# Patient Record
Sex: Female | Born: 2005 | Race: White | Hispanic: No | Marital: Single | Smoking: Never smoker
Health system: Southern US, Community
[De-identification: ages and names within clinical notes are randomized; demographics above are authoritative.]

## PROBLEM LIST (undated history)

## (undated) DIAGNOSIS — M216X1 Other acquired deformities of right foot: Secondary | ICD-10-CM

## (undated) DIAGNOSIS — E669 Obesity, unspecified: Secondary | ICD-10-CM

## (undated) DIAGNOSIS — F840 Autistic disorder: Secondary | ICD-10-CM

## (undated) DIAGNOSIS — R159 Full incontinence of feces: Secondary | ICD-10-CM

## (undated) DIAGNOSIS — K5909 Other constipation: Secondary | ICD-10-CM

## (undated) DIAGNOSIS — R6250 Unspecified lack of expected normal physiological development in childhood: Secondary | ICD-10-CM

## (undated) DIAGNOSIS — F913 Oppositional defiant disorder: Secondary | ICD-10-CM

## (undated) DIAGNOSIS — F70 Mild intellectual disabilities: Secondary | ICD-10-CM

## (undated) DIAGNOSIS — K029 Dental caries, unspecified: Secondary | ICD-10-CM

## (undated) DIAGNOSIS — L209 Atopic dermatitis, unspecified: Secondary | ICD-10-CM

## (undated) DIAGNOSIS — Z22322 Carrier or suspected carrier of Methicillin resistant Staphylococcus aureus: Secondary | ICD-10-CM

## (undated) DIAGNOSIS — E559 Vitamin D deficiency, unspecified: Secondary | ICD-10-CM

## (undated) DIAGNOSIS — G40909 Epilepsy, unspecified, not intractable, without status epilepticus: Secondary | ICD-10-CM

## (undated) DIAGNOSIS — A4902 Methicillin resistant Staphylococcus aureus infection, unspecified site: Secondary | ICD-10-CM

## (undated) DIAGNOSIS — E039 Hypothyroidism, unspecified: Secondary | ICD-10-CM

## (undated) DIAGNOSIS — Q02 Microcephaly: Secondary | ICD-10-CM

## (undated) DIAGNOSIS — F988 Other specified behavioral and emotional disorders with onset usually occurring in childhood and adolescence: Secondary | ICD-10-CM

## (undated) DIAGNOSIS — F909 Attention-deficit hyperactivity disorder, unspecified type: Secondary | ICD-10-CM

## (undated) DIAGNOSIS — J45909 Unspecified asthma, uncomplicated: Secondary | ICD-10-CM

## (undated) HISTORY — PX: DENTAL SURGERY: SHX609

## (undated) HISTORY — DX: Autistic disorder: F84.0

## (undated) HISTORY — DX: Carrier or suspected carrier of methicillin resistant Staphylococcus aureus: Z22.322

## (undated) HISTORY — DX: Oppositional defiant disorder: F91.3

## (undated) HISTORY — DX: Attention-deficit hyperactivity disorder, unspecified type: F90.9

## (undated) HISTORY — DX: Methicillin resistant Staphylococcus aureus infection, unspecified site: A49.02

## (undated) HISTORY — DX: Other acquired deformities of right foot: M21.6X1

## (undated) HISTORY — DX: Atopic dermatitis, unspecified: L20.9

## (undated) HISTORY — DX: Full incontinence of feces: R15.9

## (undated) HISTORY — DX: Microcephaly: Q02

## (undated) HISTORY — DX: Epilepsy, unspecified, not intractable, without status epilepticus: G40.909

## (undated) HISTORY — PX: ADENOIDECTOMY: SHX5191

## (undated) HISTORY — DX: Dental caries, unspecified: K02.9

## (undated) HISTORY — DX: Mild intellectual disabilities: F70

## (undated) HISTORY — DX: Unspecified lack of expected normal physiological development in childhood: R62.50

## (undated) HISTORY — DX: Other constipation: K59.09

## (undated) HISTORY — PX: DENTAL REHABILITATION: SHX1449

## (undated) HISTORY — DX: Other specified behavioral and emotional disorders with onset usually occurring in childhood and adolescence: F98.8

## (undated) HISTORY — PX: TONSILLECTOMY: SHX5217

## (undated) HISTORY — DX: Vitamin D deficiency, unspecified: E55.9

---
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
## 2024-01-05 ENCOUNTER — Ambulatory Visit (INDEPENDENT_AMBULATORY_CARE_PROVIDER_SITE_OTHER): Payer: MEDICAID | Admitting: Family Medicine

## 2024-01-05 ENCOUNTER — Encounter: Payer: Self-pay | Admitting: Family Medicine

## 2024-01-05 DIAGNOSIS — R7301 Impaired fasting glucose: Secondary | ICD-10-CM | POA: Diagnosis not present

## 2024-01-05 LAB — BAYER DCA HB A1C WAIVED: HB A1C (BAYER DCA - WAIVED): 5.3 % (ref 4.8–5.6)

## 2024-01-05 MED ORDER — WEGOVY 1 MG/0.5ML ~~LOC~~ SOAJ: 1.0000 mg | SUBCUTANEOUS | 2 refills | Status: DC

## 2024-01-05 NOTE — Assessment & Plan Note (Signed)
 Down an additional 4 lbs for 19lbs (7.1%) total since starting wegovy. Will increase her to 1mg  wegovy in 3 weeks when she's done with her current supply and recheck in 3 months. Call with any concerns.

## 2024-01-05 NOTE — Progress Notes (Signed)
 BP 118/81 (BP Location: Left Arm, Patient Position: Sitting)   Pulse 87   Temp 98.4 F (36.9 C)   Ht 5\' 4"  (1.626 m)   Wt 248 lb (112.5 kg)   BMI 42.57 kg/m    Subjective:    Patient ID: Mia Miller, female    DOB: 12-19-2005, 18 y.o.   MRN: [id number]  HPI: Mia Miller is a 18 y.o. female  Chief Complaint  Patient presents with   Obesity   OBESITY- not throwing up, needing to take the zofran for about 3 days around the shot. Mom has not waited to see how she does- just gives her the meds Duration: chronic Previous attempts at weight loss: as able- developmentally delayed with polyphagia. Has calorie reduced diet at home  Complications of obesity: IFG Peak weight: 267 Weight loss goal: to be healthy Weight loss to date: 19 lbs Requesting obesity pharmacotherapy: yes Current weight loss supplements/medications: yes Previous weight loss supplements/meds: no  Impaired Fasting Glucose HbA1C:  Lab Results  Component Value Date   HGBA1C 5.5 10/09/2023   Duration of elevated blood sugar: chronic Polydipsia: no Polyuria: no Weight change: yes Visual disturbance: no Glucose Monitoring: no Diabetic Education: Not Completed Family history of diabetes: no   Relevant past medical, surgical, family and social history reviewed and updated as indicated. Interim medical history since our last visit reviewed. Allergies and medications reviewed and updated.  Review of Systems  Constitutional: Negative.   Respiratory: Negative.    Cardiovascular: Negative.   Gastrointestinal: Negative.   Musculoskeletal: Negative.   Psychiatric/Behavioral: Negative.      Per HPI unless specifically indicated above     Objective:    BP 118/81 (BP Location: Left Arm, Patient Position: Sitting)   Pulse 87   Temp 98.4 F (36.9 C)   Ht 5\' 4"  (1.626 m)   Wt 248 lb (112.5 kg)   BMI 42.57 kg/m   Wt Readings from Last 3 Encounters:  01/05/24 248 lb (112.5 kg) (>99%, Z= 2.45)*   11/20/23 253 lb 6.4 oz (114.9 kg) (>99%, Z= 2.49)*  10/09/23 267 lb 12.8 oz (121.5 kg) (>99%, Z= 2.57)*   * Growth percentiles are based on CDC (Girls, 2-20 Years) data.    Physical Exam Vitals and nursing note reviewed.  Constitutional:      General: She is not in acute distress.    Appearance: Normal appearance. She is obese. She is not ill-appearing, toxic-appearing or diaphoretic.  HENT:     Head: Normocephalic and atraumatic.     Right Ear: External ear normal.     Left Ear: External ear normal.     Nose: Nose normal.     Mouth/Throat:     Mouth: Mucous membranes are moist.     Pharynx: Oropharynx is clear.  Eyes:     General: No scleral icterus.       Right eye: No discharge.        Left eye: No discharge.     Extraocular Movements: Extraocular movements intact.     Conjunctiva/sclera: Conjunctivae normal.     Pupils: Pupils are equal, round, and reactive to light.  Cardiovascular:     Rate and Rhythm: Normal rate and regular rhythm.     Pulses: Normal pulses.     Heart sounds: Normal heart sounds. No murmur heard.    No friction rub. No gallop.  Pulmonary:     Effort: Pulmonary effort is normal. No respiratory distress.  Breath sounds: Normal breath sounds. No stridor. No wheezing, rhonchi or rales.  Chest:     Chest wall: No tenderness.  Musculoskeletal:        General: Normal range of motion.     Cervical back: Normal range of motion and neck supple.  Skin:    General: Skin is warm and dry.     Capillary Refill: Capillary refill takes less than 2 seconds.     Coloration: Skin is not jaundiced or pale.     Findings: No bruising, erythema, lesion or rash.  Neurological:     General: No focal deficit present.     Mental Status: She is alert and oriented to person, place, and time. Mental status is at baseline.  Psychiatric:        Mood and Affect: Mood normal.        Behavior: Behavior normal.        Thought Content: Thought content normal.         Judgment: Judgment normal.     Results for orders placed or performed in visit on 11/20/23  Lipase   Collection Time: 11/20/23 10:48 AM  Result Value Ref Range   Lipase 25 14 - 72 U/L  Amylase   Collection Time: 11/20/23 10:48 AM  Result Value Ref Range   Amylase 52 31 - 110 U/L  Comp Met (CMET)   Collection Time: 11/20/23 10:48 AM  Result Value Ref Range   Glucose 60 (L) 70 - 99 mg/dL   BUN 7 6 - 20 mg/dL   Creatinine, Ser 5.36 0.57 - 1.00 mg/dL   eGFR 644 >03 KV/QQV/9.56   BUN/Creatinine Ratio 10 9 - 23   Sodium 141 134 - 144 mmol/L   Potassium 4.5 3.5 - 5.2 mmol/L   Chloride 101 96 - 106 mmol/L   CO2 23 20 - 29 mmol/L   Calcium 10.3 (H) 8.7 - 10.2 mg/dL   Total Protein 7.6 6.0 - 8.5 g/dL   Albumin 4.4 4.0 - 5.0 g/dL   Globulin, Total 3.2 1.5 - 4.5 g/dL   Bilirubin Total <3.8 0.0 - 1.2 mg/dL   Alkaline Phosphatase 74 42 - 106 IU/L   AST 17 0 - 40 IU/L   ALT 13 0 - 32 IU/L      Assessment & Plan:   Problem List Items Addressed This Visit       Endocrine   IFG (impaired fasting glucose)   Rechecking A1c today. Await results.       Relevant Orders   Bayer DCA Hb A1c Waived     Other   Morbid obesity (HCC) - Primary   Down an additional 4 lbs for 19lbs (7.1%) total since starting wegovy. Will increase her to 1mg  wegovy in 3 weeks when she's done with her current supply and recheck in 3 months. Call with any concerns.        Relevant Medications   Semaglutide-Weight Management (WEGOVY) 1 MG/0.5ML SOAJ     Follow up plan: Return in about 3 months (around 04/05/2024).

## 2024-01-05 NOTE — Assessment & Plan Note (Signed)
Rechecking A1c today. Await results. 

## 2024-01-06 ENCOUNTER — Other Ambulatory Visit: Payer: Self-pay | Admitting: Family Medicine

## 2024-01-06 DIAGNOSIS — R1013 Epigastric pain: Secondary | ICD-10-CM

## 2024-01-08 NOTE — Telephone Encounter (Signed)
 Requested Prescriptions  Pending Prescriptions Disp Refills   omeprazole (PRILOSEC) 20 MG capsule [Pharmacy Med Name: OMEPRAZOLE 20MG  CAPSULES] 90 capsule 1    Sig: TAKE 1 CAPSULE(20 MG) BY MOUTH DAILY     Gastroenterology: Proton Pump Inhibitors Passed - 01/08/2024  8:51 AM      Passed - Valid encounter within last 12 months    Recent Outpatient Visits           3 days ago Morbid obesity [facility])   [facility] [place], Megan P, DO   1 month ago Morbid obesity [facility])   [facility] [name], DO       Future Appointments             In 1 month [name], MD [facility]

## 2024-01-21 ENCOUNTER — Other Ambulatory Visit: Payer: Self-pay | Admitting: Family Medicine

## 2024-01-22 MED ORDER — ALBUTEROL SULFATE (2.5 MG/3ML) 0.083% IN NEBU: 2.5000 mg | INHALATION_SOLUTION | Freq: Four times a day (QID) | RESPIRATORY_TRACT | 0 refills | Status: DC | PRN

## 2024-01-23 ENCOUNTER — Encounter: Payer: Self-pay | Admitting: Family Medicine

## 2024-02-21 ENCOUNTER — Other Ambulatory Visit: Payer: Self-pay | Admitting: Obstetrics

## 2024-02-23 ENCOUNTER — Encounter: Payer: Self-pay | Admitting: Family Medicine

## 2024-02-24 ENCOUNTER — Ambulatory Visit: Payer: MEDICAID | Admitting: Dermatology

## 2024-03-10 NOTE — Telephone Encounter (Unsigned)
 Copied from CRM [id number]. Topic: General - Other >> Mar 10, 2024 12:02 PM Tiffany S wrote: Reason for CRM: Patient is needing all medications to go to [facility]  [email or web address] [facility] [address] [place], [place] [address] [phone]

## 2024-03-15 ENCOUNTER — Ambulatory Visit: Payer: MEDICAID | Admitting: Dermatology

## 2024-03-15 ENCOUNTER — Encounter: Payer: Self-pay | Admitting: Dermatology

## 2024-03-15 DIAGNOSIS — Z7189 Other specified counseling: Secondary | ICD-10-CM

## 2024-03-15 DIAGNOSIS — Z79899 Other long term (current) drug therapy: Secondary | ICD-10-CM

## 2024-03-15 DIAGNOSIS — B360 Pityriasis versicolor: Secondary | ICD-10-CM

## 2024-03-15 DIAGNOSIS — L732 Hidradenitis suppurativa: Secondary | ICD-10-CM

## 2024-03-15 DIAGNOSIS — L858 Other specified epidermal thickening: Secondary | ICD-10-CM

## 2024-03-15 DIAGNOSIS — L83 Acanthosis nigricans: Secondary | ICD-10-CM | POA: Diagnosis not present

## 2024-03-15 DIAGNOSIS — Z792 Long term (current) use of antibiotics: Secondary | ICD-10-CM

## 2024-03-15 DIAGNOSIS — L219 Seborrheic dermatitis, unspecified: Secondary | ICD-10-CM

## 2024-03-15 MED ORDER — DOXYCYCLINE HYCLATE 50 MG PO CAPS: ORAL_CAPSULE | ORAL | 6 refills | Status: AC

## 2024-03-15 MED ORDER — KETOCONAZOLE 2 % EX SHAM: 1.0000 | MEDICATED_SHAMPOO | CUTANEOUS | 6 refills | Status: DC

## 2024-03-15 MED ORDER — KETOCONAZOLE 2 % EX CREA: TOPICAL_CREAM | CUTANEOUS | 6 refills | Status: DC

## 2024-03-15 MED ORDER — CLINDAMYCIN PHOSPHATE 1 % EX SOLN: CUTANEOUS | 9 refills | Status: AC

## 2024-03-15 MED ORDER — CLOBETASOL PROPIONATE 0.05 % EX FOAM: CUTANEOUS | 6 refills | Status: AC

## 2024-03-15 NOTE — Progress Notes (Signed)
 Follow-Up Visit   Subjective  Mia Miller is a 18 y.o. female who presents for the following: here today with mother for follow up on seborrheic dermatitis at scalp, mother reports patient is using ketoconazole  shampoo and clobetasol  foam on alternate days a week. Still has some flares. 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  History of hidradenitis suppurativa at b/l axilla and flanks - mother reports taking doxycycline  50 mg po qd with dinner and clindamycin  solution daily to areas. Still has some flares.  Hx of Keratosis Pilaris at arms mother would like to discuss alternative treatment patient has been breakout out at upper arms.   The patient has spots, moles and lesions to be evaluated, some may be new or changing and the patient may have concern these could be cancer.  The following portions of the chart were reviewed this encounter and updated as appropriate: medications, allergies, medical history  Review of Systems:  No other skin or systemic complaints except as noted in HPI or Assessment and Plan.  Objective  Well appearing patient in no apparent distress; mood and affect are within normal limits.   A focused examination was performed of the following areas: B/l arms, b/l legs, b/l axilla, scalp   Relevant exam findings are noted in the Assessment and Plan.   Assessment & Plan   SEBORRHEIC DERMATITIS scalp Exam: minimal flaking at scalp Chronic and persistent condition with duration or expected duration over one year. Condition is improving with treatment but not currently at goal. Seborrheic Dermatitis is a chronic persistent rash characterized by pinkness and scaling most commonly of the mid face but also can occur on the scalp (dandruff), ears; mid chest, mid back and groin.  It tends to be exacerbated by stress and cooler weather.  People who have neurologic disease may experience new onset or  exacerbation of existing seborrheic dermatitis.  The condition is not curable but treatable and can be controlled. Treatment Plan: Cont Ketoconazole  2% shampoo 3d/wk Monday, Wednesday, Friday, let sit 10 minutes and rinse out Continue Clobetasol  foam 3 nights/wk Tuesday, Thursday, Saturday, to aa scalp, avoid f/g/a   Topical steroids (such as triamcinolone , fluocinolone, fluocinonide, mometasone , clobetasol , halobetasol, betamethasone , hydrocortisone) can cause thinning and lightening of the skin if they are used for too long in the same area. Your physician has selected the right strength medicine for your problem and area affected on the body. Please use your medication only as directed by your physician to prevent side effects.     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 Currently well-controlled. Exam: legs clear today Tinea versicolor is a chronic recurrent skin rash causing discolored scaly spots most commonly seen on back, chest, and/or shoulders.  It is generally asymptomatic. The rash is due to overgrowth of a common type of yeast present on everyone's skin and it is not contagious.  It tends to flare more in the summer due to increased sweating on trunk.  After rash is treated, the scaliness will resolve, but the discoloration will take longer to return to normal pigmentation. The periodic use of an OTC medicated soap/shampoo with zinc or selenium sulfide can be helpful to prevent yeast overgrowth and recurrence. Treatment:   Cont Ketoconazole  2% cr every day prn   HIDRADENITIS SUPPURATIVA Bil axilla Exam: axillae clear today.  Chronic and persistent condition with  duration or expected duration over one year. Condition is improving with treatment but not currently at goal. Hidradenitis Suppurativa is a chronic; persistent; non-curable, but treatable condition  due to abnormal inflamed sweat glands in the body folds (axilla, inframammary, groin, medial thighs), causing recurrent painful draining cysts and scarring. It can be associated with severe scarring acne and cysts; also abscesses and scarring of scalp. The goal is control and prevention of flares, as it is not curable. Scars are permanent and can be thickened. Treatment may include daily use of topical medication and oral antibiotics.  Oral isotretinoin may also be helpful.  For some cases, Humira or Cosentyx (biologic injections) may be prescribed to decrease the inflammatory process and prevent flares.  When indicated, inflamed cysts may also be treated surgically.  Treatment Plan: Cont  Doxycycline  50mg  1 po qd with dinner (if doing well, can stop Doxycyline and re-start with flares, but if she does not do well off oral Doxycycline , may need to continue daily) Cont Clindamycin  sol qd to aa axilla, flanks   Recommend OTC Joesoef Anti Acne Sulfur Soap to use as a body wash  Doxycycline  should be taken with food to prevent nausea. Do not lay down for 30 minutes after taking. Be cautious with sun exposure and use good sun protection while on this medication. Pregnant women should not take this medication.   KERATOSIS PILARIS - Tiny follicular keratotic papules At b/l arms with  - Benign. Genetic in nature. No cure. - Observe. - If desired, patient can use an emollient (moisturizer) containing ammonium lactate (AmLactin), urea or salicylic acid once a day to smooth the area  Recommend starting moisturizer with exfoliant (Urea, Salicylic acid, or Lactic acid) one to two times daily to help smooth rough and bumpy skin.  OTC options include Cetaphil Rough and Bumpy lotion (Urea), Eucerin Roughness Relief lotion or spot treatment cream (Urea), CeraVe SA lotion/cream for Rough and Bumpy skin (Sal Acid), Gold Bond Rough and Bumpy cream (Sal Acid), and AmLactin 12% lotion/cream (Lactic Acid).  If applying in  morning, also apply sunscreen to sun-exposed areas, since these exfoliating moisturizers can increase sensitivity to sun.   Recommend OTC Joesoef Anti Acne Sulfur Soap to use as a body wash to affected areas daily.  (found in local drug store or online) (information included in handout)  SEBORRHEIC DERMATITIS   Related Medications mometasone  (ELOCON ) 0.1 % lotion APPLY TOPICALLY THREE TIMES A WEEK TO SCALP ON MONDAY, WEDNESDAY AND FRIDAYS clobetasol  (OLUX ) 0.05 % topical foam Apply topically 3 (three) times a week. Apply to scalp 3 times a week Tuesday, Thursday, Saturday at bedtime and leave on overnight, avoid face, groin, axilla ketoconazole  (NIZORAL ) 2 % shampoo Apply 1 Application topically 3 (three) times a week. Wash scalp 3 times weekly, Monday, Wednesday, Friday, let sit 10 minutes and rinse out HIDRADENITIS SUPPURATIVA   Related Medications clindamycin  (CLEOCIN  T) 1 % external solution Apply topically See admin instructions. APPLY TOPICALLY TO AXILLA ONCE DAILY doxycycline  (VIBRAMYCIN ) 50 MG capsule TAKE 1 CAPSULE BY MOUTH ONCE A DAY WITH DINNER( TAKE WITH FOOD AND DRINK) TINEA VERSICOLOR   Related Medications ketoconazole  (NIZORAL ) 2 % cream Apply topically See admin instructions. APPLY 1 APPLICATORFUL TOPICALLY TO NECK AND LEGS DAILY  Return for 8 to 9 month hs follow tv, and seb derm.  Laurelyn Ponder, CMA, am acting as scribe for Celine Collard, MD.   Documentation: I have reviewed the above documentation for accuracy and completeness, and I agree with the  above.  Celine Collard, MD

## 2024-03-15 NOTE — Patient Instructions (Addendum)
 Continue for seborrheic dermatitis at scalp   Cont Ketoconazole  2% shampoo 3d/wk Monday, Wednesday, Friday, let sit 10 minutes and rinse out Continue Clobetasol  foam 3 nights/wk Tuesday, Thursday, Saturday, to aa scalp, avoid f/g/a  For tinea versicolor at legs  Continue ketoconazole  cream as needed for flares  For Hidradenitis Suppurativa at b/l under arms  Continue   Doxycycline  50mg  1 by mouth daily  with dinner Cont Clindamycin  sol daily to aa axilla, flanks    For Keratosis pilaris at arms  Can use JOESOEF SKIN CARE Sulfur Soap for Acne Pharmaceutical Grade Dermatologists Approved for Acne Rosacea 100G Can by purchased online or found in local drug store      Due to recent changes in healthcare laws, you may see results of your pathology and/or laboratory studies on MyChart before the doctors have had a chance to review them. We understand that in some cases there may be results that are confusing or concerning to you. Please understand that not all results are received at the same time and often the doctors may need to interpret multiple results in order to provide you with the best plan of care or course of treatment. Therefore, we ask that you please give us  2 business days to thoroughly review all your results before contacting the office for clarification. Should we see a critical lab result, you will be contacted sooner.   If You Need Anything After Your Visit  If you have any questions or concerns for your doctor, please call our main line at [phone] and press option 4 to reach your doctor's medical assistant. If no one answers, please leave a voicemail as directed and we will return your call as soon as possible. Messages left after 4 pm will be answered the following business day.   You may also send us  a message via MyChart. We typically respond to MyChart messages within 1-2 business days.  For prescription refills, please ask your pharmacy to contact  our office. Our fax number is [phone].  If you have an urgent issue when the clinic is closed that cannot wait until the next business day, you can page your doctor at the number below.    Please note that while we do our best to be available for urgent issues outside of office hours, we are not available 24/7.   If you have an urgent issue and are unable to reach us , you may choose to seek medical care at your doctor's office, retail clinic, urgent care center, or emergency room.  If you have a medical emergency, please immediately call 911 or go to the emergency department.  Pager Numbers  - Dr. Bary Likes: [phone]  - Dr. Annette Barters: [phone]  - Dr. Felipe Horton: [phone]   In the event of inclement weather, please call our main line at [phone] for an update on the status of any delays or closures.  Dermatology Medication Tips: Please keep the boxes that topical medications come in in order to help keep track of the instructions about where and how to use these. Pharmacies typically print the medication instructions only on the boxes and not directly on the medication tubes.   If your medication is too expensive, please contact our office at [phone] option 4 or send us  a message through MyChart.   We are unable to tell what your co-pay for medications will be in advance as this is different depending on your insurance coverage. However, we may be able to find  a substitute medication at lower cost or fill out paperwork to get insurance to cover a needed medication.   If a prior authorization is required to get your medication covered by your insurance company, please allow us  1-2 business days to complete this process.  Drug prices often vary depending on where the prescription is filled and some pharmacies may offer cheaper prices.  The website www.goodrx.com contains coupons for medications through different pharmacies. The prices here do not account for what the  cost may be with help from insurance (it may be cheaper with your insurance), but the website can give you the price if you did not use any insurance.  - You can print the associated coupon and take it with your prescription to the pharmacy.  - You may also stop by our office during regular business hours and pick up a GoodRx coupon card.  - If you need your prescription sent electronically to a different pharmacy, notify our office through [facility] or by phone at [phone] option 4.     Si Usted Necesita Algo Despus de Su Visita  Tambin puede enviarnos un mensaje a travs de Clinical cytogeneticist. Por lo general respondemos a los mensajes de MyChart en el transcurso de 1 a 2 das hbiles.  Para renovar recetas, por favor pida a su farmacia que se ponga en contacto con nuestra oficina. Franz Jacks de fax es [place] [phone].  Si tiene un asunto urgente cuando la clnica est cerrada y que no puede esperar hasta el siguiente da hbil, puede llamar/localizar a su doctor(a) al nmero que aparece a continuacin.   Por favor, tenga en cuenta que aunque hacemos todo lo posible para estar disponibles para asuntos urgentes fuera del horario de [place], no estamos disponibles las 24 horas del da, los 7 [address]  [address] de la [place].   Si tiene un problema urgente y no puede comunicarse con nosotros, puede optar por buscar atencin mdica  en el consultorio de su doctor(a), en una clnica privada, en un centro de atencin urgente o en una sala de emergencias.  Si tiene Engineer, drilling, por favor llame inmediatamente al 911 o vaya a la sala de emergencias.  Nmeros de bper  - Dr. Bary Likes: [phone]  - Dra. Annette Barters: [phone]  - Dr. Felipe Horton: [phone]   En caso de inclemencias del tiempo, por favor llame a Lajuan Pila principal al [phone] para una actualizacin sobre el [place] de cualquier retraso o cierre.  Consejos para la medicacin en dermatologa: Por favor, guarde las cajas  en las que vienen los medicamentos de uso tpico para ayudarle a seguir las instrucciones sobre dnde y cmo usarlos. Las farmacias generalmente imprimen las instrucciones del medicamento slo en las cajas y no directamente en los tubos del [place].   Si su medicamento es muy caro, por favor, pngase en contacto con Bettyjane Brunet llamando al [phone] y presione la opcin 4 o envenos un mensaje a travs de Clinical cytogeneticist.   No podemos decirle cul ser su copago por los medicamentos por adelantado ya que esto es diferente dependiendo de la cobertura de su seguro. Sin embargo, es posible que podamos encontrar un medicamento sustituto a Audiological scientist un formulario para que el seguro cubra el medicamento que se considera necesario.   Si se requiere una autorizacin previa para que su compaa de seguros [place] su medicamento, por favor permtanos de 1 a 2 das hbiles para completar este proceso.  Los precios de los medicamentos varan con frecuencia dependiendo  del lugar de dnde se surte la receta y alguna farmacias pueden ofrecer precios ms baratos.  El sitio web www.goodrx.com tiene cupones para medicamentos de Health and safety inspector. Los precios aqu no tienen en cuenta lo que podra costar con la ayuda del seguro (puede ser ms barato con su seguro), pero el sitio web puede darle el precio si no utiliz Tourist information centre manager.  - Puede imprimir el cupn correspondiente y llevarlo con su receta a la farmacia.  - Tambin puede pasar por nuestra oficina durante el horario de atencin regular y Education officer, museum una tarjeta de cupones de GoodRx.  - Si necesita que su receta se enve electrnicamente a una farmacia diferente, informe a nuestra oficina a travs de MyChart de [place] o por telfono llamando al [phone] y presione la opcin 4.

## 2024-03-16 ENCOUNTER — Other Ambulatory Visit: Payer: Self-pay

## 2024-03-16 DIAGNOSIS — R1013 Epigastric pain: Secondary | ICD-10-CM

## 2024-03-16 MED ORDER — ALBUTEROL SULFATE HFA 108 (90 BASE) MCG/ACT IN AERS: 2.0000 | INHALATION_SPRAY | RESPIRATORY_TRACT | 0 refills | Status: DC | PRN

## 2024-03-16 MED ORDER — NYSTATIN 100000 UNIT/GM EX CREA: TOPICAL_CREAM | Freq: Two times a day (BID) | CUTANEOUS | 0 refills | Status: DC

## 2024-03-16 MED ORDER — OMEPRAZOLE 20 MG PO CPDR: DELAYED_RELEASE_CAPSULE | ORAL | 0 refills | Status: DC

## 2024-03-16 MED ORDER — ROPINIROLE HCL 0.5 MG PO TABS: ORAL_TABLET | ORAL | 0 refills | Status: DC

## 2024-03-16 MED ORDER — METFORMIN HCL 500 MG PO TABS: 500.0000 mg | ORAL_TABLET | Freq: Two times a day (BID) | ORAL | 0 refills | Status: DC

## 2024-03-16 MED ORDER — BUDESONIDE-FORMOTEROL FUMARATE 80-4.5 MCG/ACT IN AERO: INHALATION_SPRAY | RESPIRATORY_TRACT | 0 refills | Status: DC

## 2024-03-16 MED ORDER — ONDANSETRON 4 MG PO TBDP: 4.0000 mg | ORAL_TABLET | Freq: Three times a day (TID) | ORAL | 0 refills | Status: DC | PRN

## 2024-03-16 MED ORDER — ALBUTEROL SULFATE (2.5 MG/3ML) 0.083% IN NEBU: 2.5000 mg | INHALATION_SOLUTION | Freq: Four times a day (QID) | RESPIRATORY_TRACT | 0 refills | Status: DC | PRN

## 2024-03-16 NOTE — Telephone Encounter (Signed)
 Copied from CRM [id number]. Topic: General - Other >> Mar 10, 2024 12:02 PM Tiffany S wrote: Reason for CRM: Patient is needing all medications to go to [facility]  [email or web address] [address] [facility] [address] [place], [place] [address] [phone] >> Mar 16, 2024 11:24 AM Crispin Dolphin wrote: [facility] Called. States pt changing to their pharmacy. They need med list and new Rx for any maintenance drugs. Fax: [phone]. Thank You

## 2024-03-17 ENCOUNTER — Other Ambulatory Visit: Payer: Self-pay | Admitting: Family Medicine

## 2024-03-17 ENCOUNTER — Ambulatory Visit: Payer: Self-pay

## 2024-03-17 MED ORDER — WEGOVY 1 MG/0.5ML ~~LOC~~ SOAJ: 1.0000 mg | SUBCUTANEOUS | 0 refills | Status: DC

## 2024-03-17 NOTE — Telephone Encounter (Signed)
Has been faxed as requested.  ?

## 2024-03-17 NOTE — Telephone Encounter (Signed)
 Copied from CRM [id number]. Topic: Clinical - Medication Refill >> Mar 17, 2024  3:44 PM Adaline Holly wrote: Medication: NYSTATIN  powder  Has the patient contacted their pharmacy? No   This is the patient's preferred pharmacy:  [facility] [place] [place] - [address] [place] [address] Phone: [phone] Fax: [phone]  Is this the correct pharmacy for this prescription? Yes    Has the prescription been filled recently? Yes  Is the patient out of the medication? Yes  Has the patient been seen for an appointment in the last year OR does the patient have an upcoming appointment? Yes  Can we respond through MyChart? Yes

## 2024-03-17 NOTE — Telephone Encounter (Signed)
 Copied from CRM [id number]. Topic: Clinical - Medication Question >> Mar 17, 2024  4:10 PM Adaline Holly wrote: Ethyl Hering with [facility] called to get a drug list faxed over for patient.

## 2024-03-19 NOTE — Telephone Encounter (Signed)
 Requested medication (s) are due for refill today: yes  Requested medication (s) are on the active medication list: yes  Last refill:  10/10/23 60 grams 4 RF  Future visit scheduled: yes  Notes to clinic:  med not assigned to protocol   Requested Prescriptions  Pending Prescriptions Disp Refills   nystatin  powder 60 g 4     Off-Protocol Failed - 03/19/2024 11:04 AM      Failed - Medication not assigned to a protocol, review manually.      Passed - Valid encounter within last 12 months    Recent Outpatient Visits           2 months ago Morbid obesity [facility])   [place] [facility] [place], Megan P, DO   4 months ago Morbid obesity [facility])    [facility] Solomon Dupre, DO       Future Appointments             In 8 months Elta Halter, MD [facility] [place] [facility]

## 2024-03-20 ENCOUNTER — Encounter: Payer: Self-pay | Admitting: Family Medicine

## 2024-03-20 ENCOUNTER — Encounter: Payer: Self-pay | Admitting: Dermatology

## 2024-03-21 MED ORDER — NYSTATIN 100000 UNIT/GM EX POWD: Freq: Three times a day (TID) | CUTANEOUS | 4 refills | Status: AC

## 2024-03-23 ENCOUNTER — Encounter: Payer: Self-pay | Admitting: Family Medicine

## 2024-03-26 ENCOUNTER — Other Ambulatory Visit: Payer: Self-pay | Admitting: Family Medicine

## 2024-03-30 ENCOUNTER — Other Ambulatory Visit: Payer: Self-pay | Admitting: Family Medicine

## 2024-03-30 NOTE — Telephone Encounter (Signed)
 Requested medication (s) are due for refill today: yes  Requested medication (s) are on the active medication list: yes  Last refill:  Vitamin D : 10/17/23 12 caps 1 RF              Nystatin : 03/21/24 60 grams 4 RF  Future visit scheduled: yes  Notes to clinic:  Vitamin D  not delegated to NT to RF and Nystatin  not assigned to a protocol   Requested Prescriptions  Pending Prescriptions Disp Refills   Vitamin D , Ergocalciferol , (DRISDOL ) 1.25 MG (50000 UNIT) CAPS capsule [Pharmacy Med Name: VITAMIN D2 50,000IU (ERGO) CAP RX] 12 capsule 1    Sig: TAKE 1 CAPSULE BY MOUTH EVERY 7 DAYS     Endocrinology:  Vitamins - Vitamin D  Supplementation 2 Failed - 03/30/2024 11:00 AM      Failed - Manual Review: Route requests for 50,000 IU strength to the provider      Failed - Ca in normal range and within 360 days    Calcium  Date Value Ref Range Status  11/20/2023 10.3 (H) 8.7 - 10.2 mg/dL Final         Failed - Vitamin D  in normal range and within 360 days    Vit D, 25-Hydroxy  Date Value Ref Range Status  10/09/2023 23.2 (L) 30.0 - 100.0 ng/mL Final    Comment:    Vitamin D  deficiency has been defined by the Institute of Medicine and an Endocrine Society practice guideline as a level of serum 25-OH vitamin D  less than 20 ng/mL (1,2). The Endocrine Society went on to further define vitamin D  insufficiency as a level between 21 and 29 ng/mL (2). 1. IOM (Institute of Medicine). 2010. Dietary reference    intakes for calcium and D. [place]: The    [employer]. 2. Holick MF, Binkley [place], Bischoff-Ferrari HA, et al.    Evaluation, treatment, and prevention of vitamin D     deficiency: an Endocrine Society clinical practice    guideline. JCEM. 2011 Jul; 96(7):1911-30.          Passed - Valid encounter within last 12 months    Recent Outpatient Visits           2 months ago Morbid obesity [facility])   [place] [facility] [place], Megan P, DO   4 months ago  [facility]bid obesity [facility])   [place] [facility] Vicci Duwaine SQUIBB, DO       Future Appointments             In 7 months Hester Alm BROCKS, MD [place] [facility]             NYSTATIN  powder [Pharmacy Med Name: NYSTOP  TOP PWDR 100,000 15GM] 15 g     Sig: APPLY TOPICALLY THREE TIMES DAILY     Off-Protocol Failed - 03/30/2024 11:00 AM      Failed - Medication not assigned to a protocol, review manually.      Passed - Valid encounter within last 12 months    Recent Outpatient Visits           2 months ago [facility]bid [facility])   [place] [facility] [place], Megan P, DO   4 months ago Morbid obesity [facility])   [place] [facility] Vicci Duwaine SQUIBB, DO       Future Appointments             In 7 months Hester Alm BROCKS, MD  [facility] [place] [facility]

## 2024-03-30 NOTE — Telephone Encounter (Signed)
 Copied from CRM [id number]. Topic: Clinical - Prescription Issue >> Mar 30, 2024  9:28 AM Willma R wrote: Reason for CRM: Pel from [facility] pharmacy calling regards to patients refill for nystatin  powder and Vitamin D , Ergocalciferol , (DRISDOL ) 1.25 MG (50000 UNIT) CAPS capsule. States they did not get any refill information and it asking if it can be sent again.  Pel from [facility] pharmacy can be reached at [phone]

## 2024-04-05 ENCOUNTER — Ambulatory Visit: Payer: MEDICAID | Admitting: Family Medicine

## 2024-04-08 ENCOUNTER — Encounter: Payer: Self-pay | Admitting: Family Medicine

## 2024-04-14 ENCOUNTER — Other Ambulatory Visit: Payer: Self-pay | Admitting: Family Medicine

## 2024-04-16 ENCOUNTER — Other Ambulatory Visit: Payer: Self-pay | Admitting: Family Medicine

## 2024-04-16 NOTE — Telephone Encounter (Signed)
 Mom calling back and requesting refill on nyastatin powder and vitamin D . Mom states she forgot to include these in earlier refill request.

## 2024-04-16 NOTE — Telephone Encounter (Unsigned)
 Copied from CRM [id number]. Topic: Clinical - Medication Refill >> Apr 16, 2024 11:06 AM Jalayah J wrote: Medication: Semaglutide -Weight Management (WEGOVY ) 1 MG/0.5ML SOAJ   Has the patient contacted their pharmacy? Yes (Agent: If no, request that the patient contact the pharmacy for the refill. If patient does not wish to contact the pharmacy document the reason why and proceed with request.) (Agent: If yes, when and what did the pharmacy advise?)  This is the patient's preferred pharmacy:  [facility] - [place], [place] - [address] [place] [address] Phone: [phone] Fax: [phone]    Is this the correct pharmacy for this prescription? Yes If no, delete pharmacy and type the correct one.   Has the prescription been filled recently? Yes  Is the patient out of the medication? Yes  Has the patient been seen for an appointment in the last year OR does the patient have an upcoming appointment? Yes  Can we respond through MyChart? Yes  Agent: Please be advised that Rx refills may take up to 3 business days. We ask that you follow-up with your pharmacy.

## 2024-04-16 NOTE — Telephone Encounter (Signed)
 Requested Prescriptions  Pending Prescriptions Disp Refills   Semaglutide -Weight Management (WEGOVY ) 1 MG/0.5ML SOAJ [Pharmacy Med Name: WEGOVY  1 MG/0.5ML SUBQ SOLN ML] 2 mL 1    Sig: INJECT 1MG  SUBCUTANEOUSLY ONCE A WEEK     Endocrinology:  Diabetes - GLP-1 Receptor Agonists - semaglutide  Passed - 04/16/2024 11:53 AM      Passed - HBA1C in normal range and within 180 days    HB A1C (BAYER DCA - WAIVED)  Date Value Ref Range Status  01/05/2024 5.3 4.8 - 5.6 % Final    Comment:             Prediabetes: 5.7 - 6.4          Diabetes: >6.4          Glycemic control for adults with diabetes: <7.0          Passed - Cr in normal range and within 360 days    Creat  Date Value Ref Range Status  12/01/2019 0.59 0.40 - 1.00 mg/dL Final   Creatinine, Ser  Date Value Ref Range Status  11/20/2023 0.72 0.57 - 1.00 mg/dL Final         Passed - Valid encounter within last 6 months    Recent Outpatient Visits           3 months ago Morbid obesity [facility])   [place] [facility] [place], Megan P, DO   4 months ago Morbid [facility])   [place] [facility] Vicci Duwaine SQUIBB, DO       Future Appointments             In 7 months Hester Alm BROCKS, MD [facility] [place] [facility]

## 2024-04-19 MED ORDER — WEGOVY 0.5 MG/0.5ML ~~LOC~~ SOAJ: 0.5000 mg | SUBCUTANEOUS | 1 refills | Status: DC

## 2024-04-19 NOTE — Telephone Encounter (Signed)
 Requested medication (s) are due for refill today: yes  Requested medication (s) are on the active medication list: yes  Last refill:  03/16/24 #90  Future visit scheduled: yes  Notes to clinic:  med not delegated to NT to RF   Requested Prescriptions  Pending Prescriptions Disp Refills   ondansetron  (ZOFRAN -ODT) 4 MG disintegrating tablet [Pharmacy Med Name: ONDANSETRON  4 MG ODT] 90 tablet     Sig: DISSOLVE 1 TABLET ON THE TONGUE EVERY 8 HOURS AS NEEDED FOR NAUSEA AND VOMITING.TAKE EVERY 8 HOURS ON DAY OF WEGOVY  AND 2ND DAY AFTER. OTHERWISE AS NEEDED     Not Delegated - Gastroenterology: Antiemetics - ondansetron  Failed - 04/19/2024  2:05 PM      Failed - This refill cannot be delegated      Passed - AST in normal range and within 360 days    AST  Date Value Ref Range Status  11/20/2023 17 0 - 40 IU/L Final         Passed - ALT in normal range and within 360 days    ALT  Date Value Ref Range Status  11/20/2023 13 0 - 32 IU/L Final         Passed - Valid encounter within last 6 months    Recent Outpatient Visits           3 months ago Morbid obesity (HCC)   [place] [facility] [place], Megan P, DO   5 months ago Morbid [facility])   [place] [facility] Vicci Duwaine SQUIBB, DO       Future Appointments             In 7 months Hester Alm BROCKS, MD [place] [facility]            Signed Prescriptions Disp Refills   budesonide -formoterol  (SYMBICORT ) 80-4.5 MCG/ACT inhaler 10.2 g 0    Sig: INHALE 2 PUFFS TWICE A DAY RINSE MOUTH WITH WATER AFTER EACH USE     Pulmonology:  Combination Products Passed - 04/19/2024  2:05 PM      Passed - Valid encounter within last 12 months    Recent Outpatient Visits           3 months ago Morbid obesity (HCC)   [place] [facility] [place], Megan P, DO   5 months ago Morbid obesity [facility])   [place] [facility] Vicci Duwaine SQUIBB, DO       Future  Appointments             In 7 months Hester Alm BROCKS, MD [facility]             VENTOLIN  HFA 108 (90 Base) MCG/ACT inhaler 18 g 0    Sig: INHALE 2 PUFFS INTO THE LUNGS EVERY 4 HOURS AS NEEDED FOR WHEEZING OR SHORTNESS OF BREATH     Pulmonology:  Beta Agonists 2 Passed - 04/19/2024  2:05 PM      Passed - Last BP in normal range    BP Readings from Last 1 Encounters:  01/05/24 118/81         Passed - Last Heart Rate in normal range    Pulse Readings from Last 1 Encounters:  01/05/24 87         Passed - Valid encounter within last 12 months    Recent Outpatient Visits           3 months ago  [facility]bid [facility])   [place] [facility] [place], Megan P, DO   5 months ago Morbid obesity [facility])   [place] [facility] [place], [place], DO       Future Appointments             In 7 months Hester Alm BROCKS, MD [place] [facility]             albuterol  (PROVENTIL ) (2.5 MG/3ML) 0.083% nebulizer solution 150 mL 0    Sig: USE 1 VIAL VIA NEBULIZER EVERY 6 HOURS AS NEEDED FOR WHEEZING OR SHORTNESS OF BREATH     Pulmonology:  Beta Agonists 2 Passed - 04/19/2024  2:05 PM      Passed - Last BP in normal range    BP Readings from Last 1 Encounters:  01/05/24 118/81         Passed - Last Heart Rate in normal range    Pulse Readings from Last 1 Encounters:  01/05/24 87         Passed - Valid encounter within last 12 months    Recent Outpatient Visits           3 months ago Morbid obesity [facility])   [place] [facility] [place], Megan P, DO   5 months ago Morbid obesity [facility])    [facility] Vicci Duwaine SQUIBB, DO       Future Appointments             In 7 months Hester Alm BROCKS, MD [facility]

## 2024-04-19 NOTE — Telephone Encounter (Signed)
 Requested Prescriptions  Pending Prescriptions Disp Refills   Semaglutide -Weight Management (WEGOVY ) 0.5 MG/0.5ML SOAJ 2 mL 1    Sig: Inject 0.5 mg into the skin once a week.     Endocrinology:  Diabetes - GLP-1 Receptor Agonists - semaglutide  Passed - 04/19/2024 12:40 PM      Passed - HBA1C in normal range and within 180 days    HB A1C (BAYER DCA - WAIVED)  Date Value Ref Range Status  01/05/2024 5.3 4.8 - 5.6 % Final    Comment:             Prediabetes: 5.7 - 6.4          Diabetes: >6.4          Glycemic control for adults with diabetes: <7.0          Passed - Cr in normal range and within 360 days    Creat  Date Value Ref Range Status  12/01/2019 0.59 0.40 - 1.00 mg/dL Final   Creatinine, Ser  Date Value Ref Range Status  11/20/2023 0.72 0.57 - 1.00 mg/dL Final         Passed - Valid encounter within last 6 months    Recent Outpatient Visits           3 months ago [facility]bid obesity [facility])   [place] [facility] [place], Megan P, DO   5 months ago Morbid obesity [facility])   [place] Vicci Duwaine SQUIBB, DO       Future Appointments             In 7 months Hester Alm BROCKS, MD [facility] [place]

## 2024-04-19 NOTE — Telephone Encounter (Signed)
 Copied from CRM [id number]. Topic: Clinical - Medication Question >> Apr 19, 2024 12:49 PM Carlyon D wrote: Reason for CRM: Mandy from the pharmacy is calling in regards to pt Wegovy . She wants to verify that the dosge is suppose to be 0.5 instead of 1 mg as the pt was previously on 1 mg and is now going to  a lower dosage. Mandy at the pharmacy would like a Call back the number to reach her is... [phone]

## 2024-04-19 NOTE — Telephone Encounter (Signed)
 Requested Prescriptions  Pending Prescriptions Disp Refills   budesonide -formoterol  (SYMBICORT ) 80-4.5 MCG/ACT inhaler [Pharmacy Med Name: SYMBICORT  80-4.5 MCG/ACT INH AERO G] 10.2 g 0    Sig: INHALE 2 PUFFS TWICE A DAY RINSE MOUTH WITH WATER AFTER EACH USE     Pulmonology:  Combination Products Passed - 04/19/2024  2:04 PM      Passed - Valid encounter within last 12 months    Recent Outpatient Visits           3 months ago [facility]bid [facility])   [place] [facility] [place], Megan P, DO   5 months ago Morbid obesity [facility])   [place] [facility] Vicci Duwaine SQUIBB, DO       Future Appointments             In 7 months Hester Alm BROCKS, MD [facility]             VENTOLIN  HFA 108 [phone] Base) MCG/ACT inhaler [Pharmacy Med Name: VENTOLIN  HFA 108 (90 BASE) MCG/ACT] 18 g 0    Sig: INHALE 2 PUFFS INTO THE LUNGS EVERY 4 HOURS AS NEEDED FOR WHEEZING OR SHORTNESS OF BREATH     Pulmonology:  Beta Agonists 2 Passed - 04/19/2024  2:04 PM      Passed - Last BP in normal range    BP Readings from Last 1 Encounters:  01/05/24 118/81         Passed - Last Heart Rate in normal range    Pulse Readings from Last 1 Encounters:  01/05/24 87         Passed - Valid encounter within last 12 months    Recent Outpatient Visits           3 months ago Morbid obesity (HCC)   [place] [facility] [place], Megan P, DO   5 months ago Morbid obesity [facility])   [place] [facility] Vicci Duwaine SQUIBB, DO       Future Appointments             In 7 months Hester Alm BROCKS, MD [place] [facility]             ondansetron  (ZOFRAN -ODT) 4 MG disintegrating tablet [Pharmacy Med Name: ONDANSETRON  4 MG ODT] 90 tablet     Sig: DISSOLVE 1 TABLET ON THE TONGUE EVERY 8 HOURS AS NEEDED FOR NAUSEA AND VOMITING.TAKE EVERY 8 HOURS ON DAY OF WEGOVY  AND 2ND DAY AFTER. OTHERWISE AS NEEDED     Not Delegated -  Gastroenterology: Antiemetics - ondansetron  Failed - 04/19/2024  2:04 PM      Failed - This refill cannot be delegated      Passed - AST in normal range and within 360 days    AST  Date Value Ref Range Status  11/20/2023 17 0 - 40 IU/L Final         Passed - ALT in normal range and within 360 days    ALT  Date Value Ref Range Status  11/20/2023 13 0 - 32 IU/L Final         Passed - Valid encounter within last 6 months    Recent Outpatient Visits           3 months ago Morbid [facility])   [place] [facility] [place], Megan P, DO   5 months ago Morbid obesity [facility])   [place] [facility] [place], [place],  DO       Future Appointments             In 7 months Hester Alm BROCKS, MD [place] [facility]             albuterol  (PROVENTIL ) (2.5 MG/3ML) 0.083% nebulizer solution [Pharmacy Med Name: ALBUTEROL  SULFATE (2.5 MG/3ML) 0.08] 150 mL 0    Sig: USE 1 VIAL VIA NEBULIZER EVERY 6 HOURS AS NEEDED FOR WHEEZING OR SHORTNESS OF BREATH     Pulmonology:  Beta Agonists 2 Passed - 04/19/2024  2:04 PM      Passed - Last BP in normal range    BP Readings from Last 1 Encounters:  01/05/24 118/81         Passed - Last Heart Rate in normal range    Pulse Readings from Last 1 Encounters:  01/05/24 87         Passed - Valid encounter within last 12 months    Recent Outpatient Visits           3 months ago [facility]bid [facility])   [place] [facility] [place], Megan P, DO   5 months ago [facility]bid [facility])   [place] [facility] Vicci Duwaine SQUIBB, DO       Future Appointments             In 7 months Hester Alm BROCKS, MD [facility] [place] [facility]

## 2024-04-20 ENCOUNTER — Other Ambulatory Visit: Payer: Self-pay | Admitting: Family Medicine

## 2024-04-20 NOTE — Telephone Encounter (Signed)
 Copied from CRM [id number]. Topic: Clinical - Medication Refill >> Apr 20, 2024 11:50 AM Wess RAMAN wrote: Medication: BLISOVI FE 1/20 1-20 MG-MCG tablet   Has the patient contacted their pharmacy? Yes (Agent: If no, request that the patient contact the pharmacy for the refill. If patient does not wish to contact the pharmacy document the reason why and proceed with request.) (Agent: If yes, when and what did the pharmacy advise?)  This is the patient's preferred pharmacy:  [facility] - [place], [place] - [address] [place] [address] Phone: [phone] Fax: [phone]  Is this the correct pharmacy for this prescription? Yes If no, delete pharmacy and type the correct one.   Has the prescription been filled recently? Yes  Is the patient out of the medication? Yes  Has the patient been seen for an appointment in the last year OR does the patient have an upcoming appointment? Yes  Can we respond through MyChart? Yes  Agent: Please be advised that Rx refills may take up to 3 business days. We ask that you follow-up with your pharmacy.

## 2024-04-20 NOTE — Telephone Encounter (Signed)
 Copied from CRM [id number]. Topic: Clinical - Prescription Issue >> Apr 20, 2024 11:51 AM Wess RAMAN wrote: Reason for CRM: Lorn from [facility] stated the pharmacy needs clarification on which wegovy  strength to provide and a new prescription for Semaglutide -Weight Management (WEGOVY ) 0.5 MG/0.5ML SOAJ  Pharmacy: [facility] - [place] - [address] [place] [address] Phone: [phone] Fax: [phone] Hours: Not open 24 hours

## 2024-04-22 NOTE — Telephone Encounter (Signed)
 Copied from CRM [id number]. Topic: Clinical - Medication Question >> Apr 22, 2024 10:34 AM Daved SQUIBB wrote: Reason for CRM: Lorn from [facility] called for an update on med request [phone] is her callback

## 2024-04-22 NOTE — Telephone Encounter (Signed)
 Spoke with [employer]. Corrected prescriber on patient birth control and who it should be sent to instead. No needs at this time.

## 2024-04-22 NOTE — Telephone Encounter (Signed)
 Requested medication (s) are due for refill today: yes  Requested medication (s) are on the active medication list: yes  Last refill:  02/23/24  Future visit scheduled: no  Notes to clinic:  Unable to refill per protocol, last refill by another provider. Routing to PCP for review.     Requested Prescriptions  Pending Prescriptions Disp Refills   norethindrone-ethinyl estradiol -FE (BLISOVI FE 1/20) 1-20 MG-MCG tablet 84 tablet 3    Sig: Take 1 tablet by mouth daily.     OB/GYN:  Contraceptives Passed - 04/22/2024  9:15 AM      Passed - Last BP in normal range    BP Readings from Last 1 Encounters:  01/05/24 118/81         Passed - Valid encounter within last 12 months    Recent Outpatient Visits           3 months ago Morbid obesity [facility])   [place] [facility] [place], Megan P, DO   5 months ago Morbid [facility])   [place] [facility] Vicci Duwaine SQUIBB, DO       Future Appointments             In 6 months Hester Alm BROCKS, MD [facility] [place] [facility]            Passed - Patient is not a smoker

## 2024-05-12 ENCOUNTER — Other Ambulatory Visit: Payer: Self-pay | Admitting: Family Medicine

## 2024-05-14 NOTE — Telephone Encounter (Signed)
 Too soon for refill.  Requested Prescriptions  Pending Prescriptions Disp Refills   albuterol  (PROVENTIL ) (2.5 MG/3ML) 0.083% nebulizer solution [Pharmacy Med Name: ALBUTEROL  SULFATE (2.5 MG/3ML) 0.08] 150 mL 0    Sig: USE 1 VIAL VIA NEBULIZER EVERY 6 HOURS AS NEEDED FOR WHEEZING OR SHORTNESS OF BREATH     Pulmonology:  Beta Agonists 2 Passed - 05/14/2024  1:12 PM      Passed - Last BP in normal range    BP Readings from Last 1 Encounters:  01/05/24 118/81         Passed - Last Heart Rate in normal range    Pulse Readings from Last 1 Encounters:  01/05/24 87         Passed - Valid encounter within last 12 months    Recent Outpatient Visits           4 months ago Morbid obesity (HCC)   [place] [facility] [place], Megan P, DO   5 months ago Morbid obesity [facility])   [place] [facility] [place], [place], DO       Future Appointments             In 6 months Hester Alm BROCKS, MD [place] [facility]             VENTOLIN  HFA 108 [phone] Base) MCG/ACT inhaler [Pharmacy Med Name: VENTOLIN  HFA 108 (90 BASE) MCG/ACT] 18 g 0    Sig: INHALE 2 PUFFS INTO THE LUNGS EVERY 4 HOURS AS NEEDED FOR WHEEZING OR SHORTNESS OF BREATH     Pulmonology:  Beta Agonists 2 Passed - 05/14/2024  1:12 PM      Passed - Last BP in normal range    BP Readings from Last 1 Encounters:  01/05/24 118/81         Passed - Last Heart Rate in normal range    Pulse Readings from Last 1 Encounters:  01/05/24 87         Passed - Valid encounter within last 12 months    Recent Outpatient Visits           4 months ago Morbid obesity (HCC)   [place] [facility] [place], Megan P, DO   5 months ago Morbid obesity [facility])   [place] [facility] Vicci Duwaine SQUIBB, DO       Future Appointments             In 6 months Hester Alm BROCKS, MD [place] [facility]             budesonide -formoterol  (SYMBICORT ) 80-4.5  MCG/ACT inhaler [Pharmacy Med Name: SYMBICORT  80-4.5 MCG/ACT INH AERO G] 10.2 g 0    Sig: INHALE 2 PUFFS TWICE A DAY RINSE MOUTH WITH WATER AFTER EACH USE     Pulmonology:  Combination Products Passed - 05/14/2024  1:12 PM      Passed - Valid encounter within last 12 months    Recent Outpatient Visits           4 months ago [facility]bid [facility])   [place] [facility] [place], Megan P, DO   5 months ago Morbid obesity [facility])    [facility] Vicci Duwaine SQUIBB, DO       Future Appointments             In 6 months Hester Alm BROCKS, MD [facility] [place] [facility]

## 2024-05-17 ENCOUNTER — Other Ambulatory Visit: Payer: Self-pay | Admitting: Family Medicine

## 2024-05-19 ENCOUNTER — Other Ambulatory Visit: Payer: Self-pay | Admitting: Family Medicine

## 2024-05-19 NOTE — Telephone Encounter (Signed)
 Copied from CRM [id number]. Topic: Clinical - Medication Refill >> May 19, 2024 11:13 AM Sophia H wrote: Medication: albuterol  (PROVENTIL ) (2.5 MG/3ML) 0.083% nebulizer solution budesonide -formoterol  (SYMBICORT ) 80-4.5 MCG/ACT inhaler  VENTOLIN  HFA 108 (90 Base) MCG/ACT inhaler ondansetron  (ZOFRAN -ODT) 4 MG disintegrating tablet   Has the patient contacted their pharmacy? Yes, pharmacy calling on behalf of the patient to check in on fills. Faxed over requests with no response.   This is the patient's preferred pharmacy:  [facility] - [place], [place] - [address] [place] [address] Phone: [phone] Fax: [phone]  Is this the correct pharmacy for this prescription? Yes If no, delete pharmacy and type the correct one.   Has the prescription been filled recently? Yes  Is the patient out of the medication? Yes  Has the patient been seen for an appointment in the last year OR does the patient have an upcoming appointment? Yes, has an appt on 08/18.   Can we respond through MyChart? Yes  Agent: Please be advised that Rx refills may take up to 3 business days. We ask that you follow-up with your pharmacy.

## 2024-05-20 NOTE — Telephone Encounter (Signed)
 Requested Prescriptions  Pending Prescriptions Disp Refills   ondansetron  (ZOFRAN -ODT) 4 MG disintegrating tablet [Pharmacy Med Name: ONDANSETRON  4 MG ODT] 90 tablet 0    Sig: DISSOLVE 1 TABLET ON THE TONGUE EVERY 8 HOURS AS NEEDED FOR NAUSEA AND VOMITING.TAKE EVERY 8 HOURS ON DAY OF WEGOVY  AND 2ND DAY AFTER. OTHERWISE AS NEEDED     Not Delegated - Gastroenterology: Antiemetics - ondansetron  Failed - 05/20/2024  1:21 PM      Failed - This refill cannot be delegated      Passed - AST in normal range and within 360 days    AST  Date Value Ref Range Status  11/20/2023 17 0 - 40 IU/L Final         Passed - ALT in normal range and within 360 days    ALT  Date Value Ref Range Status  11/20/2023 13 0 - 32 IU/L Final         Passed - Valid encounter within last 6 months    Recent Outpatient Visits           4 months ago Morbid obesity (HCC)   [place] [facility] [place], Megan P, DO   6 months ago Morbid obesity [facility])   [place] [facility] [place], Duwaine SQUIBB, DO       Future Appointments             In 6 months Hester Alm BROCKS, MD [place] [facility]             albuterol  (PROVENTIL ) (2.5 MG/3ML) 0.083% nebulizer solution [Pharmacy Med Name: ALBUTEROL  SULFATE (2.5 MG/3ML) 0.08] 150 mL 0    Sig: USE 1 VIAL VIA NEBULIZER EVERY 6 HOURS AS NEEDED FOR WHEEZING OR SHORTNESS OF BREATH     Pulmonology:  Beta Agonists 2 Passed - 05/20/2024  1:21 PM      Passed - Last BP in normal range    BP Readings from Last 1 Encounters:  01/05/24 118/81         Passed - Last Heart Rate in normal range    Pulse Readings from Last 1 Encounters:  01/05/24 87         Passed - Valid encounter within last 12 months    Recent Outpatient Visits           4 months ago Morbid obesity (HCC)   [place] [facility] [place], Megan P, DO   6 months ago Morbid obesity [facility])   [place] [facility] [place] Duwaine SQUIBB, DO        Future Appointments             In 6 months Hester Alm BROCKS, MD [place] [facility]             VENTOLIN  HFA 108 [phone] Base) MCG/ACT inhaler [Pharmacy Med Name: VENTOLIN  HFA 108 (90 BASE) MCG/ACT] 18 g 0    Sig: INHALE 2 PUFFS INTO THE LUNGS EVERY 4 HOURS AS NEEDED FOR WHEEZING OR SHORTNESS OF BREATH     Pulmonology:  Beta Agonists 2 Passed - 05/20/2024  1:21 PM      Passed - Last BP in normal range    BP Readings from Last 1 Encounters:  01/05/24 118/81         Passed - Last Heart Rate in normal range    Pulse Readings from Last 1 Encounters:  01/05/24 87  Passed - Valid encounter within last 12 months    Recent Outpatient Visits           4 months ago Morbid obesity [facility])   [place] [facility] [place], Megan P, DO   6 months ago Morbid obesity [facility])   [place] [facility] [place] Duwaine SQUIBB, DO       Future Appointments             In 6 months Hester Alm BROCKS, MD [place] [facility]             budesonide -formoterol  (SYMBICORT ) 80-4.5 MCG/ACT inhaler [Pharmacy Med Name: SYMBICORT  80-4.5 MCG/ACT INH AERO G] 10.2 g 0    Sig: INHALE 2 PUFFS TWICE A DAY RINSE MOUTH WITH WATER AFTER EACH USE     Pulmonology:  Combination Products Passed - 05/20/2024  1:21 PM      Passed - Valid encounter within last 12 months    Recent Outpatient Visits           4 months ago Morbid obesity [facility])   [place] [facility] [place], Megan P, DO   6 months ago Morbid obesity [facility])   [place] [facility] [place] Duwaine SQUIBB, DO       Future Appointments             In 6 months Hester Alm BROCKS, MD [facility]

## 2024-05-20 NOTE — Telephone Encounter (Signed)
 Requested medication (s) are due for refill today: yes  Requested medication (s) are on the active medication list: {Yes  Last refill:    Future visit scheduled: {Yes  Notes to clinic:  Unable to refill per protocol, cannot delegate.      Requested Prescriptions  Pending Prescriptions Disp Refills   ondansetron  (ZOFRAN -ODT) 4 MG disintegrating tablet [Pharmacy Med Name: ONDANSETRON  4 MG ODT] 90 tablet 0    Sig: DISSOLVE 1 TABLET ON THE TONGUE EVERY 8 HOURS AS NEEDED FOR NAUSEA AND VOMITING.TAKE EVERY 8 HOURS ON DAY OF WEGOVY  AND 2ND DAY AFTER. OTHERWISE AS NEEDED     Not Delegated - Gastroenterology: Antiemetics - ondansetron  Failed - 05/20/2024  1:22 PM      Failed - This refill cannot be delegated      Passed - AST in normal range and within 360 days    AST  Date Value Ref Range Status  11/20/2023 17 0 - 40 IU/L Final         Passed - ALT in normal range and within 360 days    ALT  Date Value Ref Range Status  11/20/2023 13 0 - 32 IU/L Final         Passed - Valid encounter within last 6 months    Recent Outpatient Visits           4 months ago Morbid obesity (HCC)   [place] [facility] [place], Megan P, DO   6 months ago Morbid obesity [facility])   [place] [facility] [place], [place], DO       Future Appointments             In 6 months Hester Alm BROCKS, MD [facility] [place] [facility]            Signed Prescriptions Disp Refills   albuterol  (PROVENTIL ) (2.5 MG/3ML) 0.083% nebulizer solution 150 mL 0    Sig: USE 1 VIAL VIA NEBULIZER EVERY 6 HOURS AS NEEDED FOR WHEEZING OR SHORTNESS OF BREATH     Pulmonology:  Beta Agonists 2 Passed - 05/20/2024  1:22 PM      Passed - Last BP in normal range    BP Readings from Last 1 Encounters:  01/05/24 118/81         Passed - Last Heart Rate in normal range    Pulse Readings from Last 1 Encounters:  01/05/24 87         Passed - Valid encounter within last 12 months     Recent Outpatient Visits           4 months ago Morbid obesity (HCC)   [place] [facility] [place], Megan P, DO   6 months ago Morbid obesity [facility])   [place] [facility] Vic[facility] Duwaine SQUIBB, DO       Future Appointments             In 6 months Hester Alm BROCKS, MD [place]  [facility]             VENTOLIN  HFA 108 [phone] Base) MCG/ACT inhaler 18 g 0    Sig: INHALE 2 PUFFS INTO THE LUNGS EVERY 4 HOURS AS NEEDED FOR WHEEZING OR SHORTNESS OF BREATH     Pulmonology:  Beta Agonists 2 Passed - 05/20/2024  1:22 PM      Passed - Last BP in normal range    BP Readings from Last 1 Encounters:  01/05/24 118/81  Passed - Last Heart Rate in normal range    Pulse Readings from Last 1 Encounters:  01/05/24 87         Passed - Valid encounter within last 12 months    Recent Outpatient Visits           4 months ago Morbid obesity (HCC)   [place] [facility] [place], Megan P, DO   6 months ago Morbid obesity [facility])   [place] [facility] Vic[place] Duwaine SQUIBB, DO       Future Appointments             In 6 months Hester Alm BROCKS, MD [place] [facility]             budesonide -formoterol  (SYMBICORT ) 80-4.5 MCG/ACT inhaler 10.2 g 0    Sig: INHALE 2 PUFFS TWICE A DAY RINSE MOUTH WITH WATER AFTER EACH USE     Pulmonology:  Combination Products Passed - 05/20/2024  1:22 PM      Passed - Valid encounter within last 12 months    Recent Outpatient Visits           4 months ago Morbid obesity [facility])   [place] [facility] [place], Megan P, DO   6 months ago Morbid obesity [facility])   [facility] Duwaine SQUIBB, DO       Future Appointments             In 6 months Hester Alm BROCKS, MD [facility]

## 2024-05-21 NOTE — Telephone Encounter (Signed)
 Requested Prescriptions  Refused Prescriptions Disp Refills   albuterol  (PROVENTIL ) (2.5 MG/3ML) 0.083% nebulizer solution 150 mL 0     Pulmonology:  Beta Agonists 2 Passed - 05/21/2024  2:38 PM      Passed - Last BP in normal range    BP Readings from Last 1 Encounters:  01/05/24 118/81         Passed - Last Heart Rate in normal range    Pulse Readings from Last 1 Encounters:  01/05/24 87         Passed - Valid encounter within last 12 months    Recent Outpatient Visits           4 months ago Morbid obesity (HCC)   [place] [facility] [place], Megan P, DO   6 months ago Morbid obesity [facility])   [place] [facility] Vic[facility] Duwaine SQUIBB, DO       Future Appointments             In 5 months Hester Alm BROCKS, MD [place] [facility]             budesonide -formoterol  (SYMBICORT ) 80-4.5 MCG/ACT inhaler 10.2 g 0    Sig: INHALE 2 PUFFS TWICE A DAY RINSE MOUTH WITH WATER AFTER EACH USE     Pulmonology:  Combination Products Passed - 05/21/2024  2:38 PM      Passed - Valid encounter within last 12 months    Recent Outpatient Visits           4 months ago [facility]bid [facility])   [place] [facility] [place], Megan P, DO   6 months ago Morbid obesity [facility])   [place] [facility], [place], DO       Future Appointments             In 5 months Hester Alm BROCKS, MD [place] [facility]             albuterol  (VENTOLIN  HFA) 108 [phone] Base) MCG/ACT inhaler 18 g 0    Sig: Inhale 2 puffs into the lungs every 4 (four) hours as needed for wheezing or shortness of breath.     Pulmonology:  Beta Agonists 2 Passed - 05/21/2024  2:38 PM      Passed - Last BP in normal range    BP Readings from Last 1 Encounters:  01/05/24 118/81         Passed - Last Heart Rate in normal range    Pulse Readings from Last 1 Encounters:  01/05/24 87         Passed - Valid encounter within last 12  months    Recent Outpatient Visits           4 months ago Morbid obesity [facility])   [place] [facility] [place], Megan P, DO   6 months ago Morbid obesity [facility])   [place] [facility] Vicci Duwaine SQUIBB, DO       Future Appointments             In 5 months Hester Alm BROCKS, MD [facility] [place] [facility]

## 2024-05-21 NOTE — Telephone Encounter (Signed)
 Requested Prescriptions  Refused Prescriptions Disp Refills   WEGOVY  1 MG/0.5ML SOAJ SQ injection [Pharmacy Med Name: WEGOVY  1 MG/0.5ML SUBQ SOLN ML] 2 mL 1    Sig: INJECT 1MG  SUBCUTANEOUSLY ONCE A WEEK     Endocrinology:  Diabetes - GLP-1 Receptor Agonists - semaglutide  Passed - 05/21/2024  2:03 PM      Passed - HBA1C in normal range and within 180 days    HB A1C (BAYER DCA - WAIVED)  Date Value Ref Range Status  01/05/2024 5.3 4.8 - 5.6 % Final    Comment:             Prediabetes: 5.7 - 6.4          Diabetes: >6.4          Glycemic control for adults with diabetes: <7.0          Passed - Cr in normal range and within 360 days    Creat  Date Value Ref Range Status  12/01/2019 0.59 0.40 - 1.00 mg/dL Final   Creatinine, Ser  Date Value Ref Range Status  11/20/2023 0.72 0.57 - 1.00 mg/dL Final         Passed - Valid encounter within last 6 months    Recent Outpatient Visits           4 months ago Morbid obesity [facility])   [place] [facility] [place], Megan P, DO   6 months ago Morbid obesity [facility])   [place] [facility] Vicci Duwaine SQUIBB, DO       Future Appointments             In 5 months Hester Alm BROCKS, MD [facility] [place] [facility]

## 2024-05-24 ENCOUNTER — Encounter: Payer: Self-pay | Admitting: Family Medicine

## 2024-05-24 ENCOUNTER — Ambulatory Visit (INDEPENDENT_AMBULATORY_CARE_PROVIDER_SITE_OTHER): Payer: MEDICAID | Admitting: Family Medicine

## 2024-05-24 VITALS — BP 118/90 | HR 111 | Temp 98.3°F | Ht 64.0 in | Wt 245.2 lb

## 2024-05-24 DIAGNOSIS — J4520 Mild intermittent asthma, uncomplicated: Secondary | ICD-10-CM

## 2024-05-24 DIAGNOSIS — R7301 Impaired fasting glucose: Secondary | ICD-10-CM | POA: Diagnosis not present

## 2024-05-24 DIAGNOSIS — Z0000 Encounter for general adult medical examination without abnormal findings: Secondary | ICD-10-CM | POA: Diagnosis not present

## 2024-05-24 DIAGNOSIS — E559 Vitamin D deficiency, unspecified: Secondary | ICD-10-CM | POA: Diagnosis not present

## 2024-05-24 DIAGNOSIS — R1013 Epigastric pain: Secondary | ICD-10-CM

## 2024-05-24 DIAGNOSIS — Z23 Encounter for immunization: Secondary | ICD-10-CM | POA: Diagnosis not present

## 2024-05-24 DIAGNOSIS — E538 Deficiency of other specified B group vitamins: Secondary | ICD-10-CM

## 2024-05-24 MED ORDER — OMEPRAZOLE 20 MG PO CPDR: DELAYED_RELEASE_CAPSULE | ORAL | 1 refills | Status: AC

## 2024-05-24 MED ORDER — ROPINIROLE HCL 0.5 MG PO TABS: ORAL_TABLET | ORAL | 1 refills | Status: AC

## 2024-05-24 MED ORDER — METFORMIN HCL 500 MG PO TABS: 500.0000 mg | ORAL_TABLET | Freq: Two times a day (BID) | ORAL | 1 refills | Status: DC

## 2024-05-24 MED ORDER — ONDANSETRON 4 MG PO TBDP: 4.0000 mg | ORAL_TABLET | Freq: Three times a day (TID) | ORAL | 0 refills | Status: AC | PRN

## 2024-05-24 MED ORDER — BUDESONIDE-FORMOTEROL FUMARATE 80-4.5 MCG/ACT IN AERO: INHALATION_SPRAY | RESPIRATORY_TRACT | 6 refills | Status: AC

## 2024-05-24 MED ORDER — SEMAGLUTIDE-WEIGHT MANAGEMENT 1.7 MG/0.75ML ~~LOC~~ SOAJ: 1.7000 mg | SUBCUTANEOUS | 0 refills | Status: DC

## 2024-05-24 MED ORDER — ALBUTEROL SULFATE HFA 108 (90 BASE) MCG/ACT IN AERS: 2.0000 | INHALATION_SPRAY | RESPIRATORY_TRACT | 6 refills | Status: AC | PRN

## 2024-05-24 NOTE — Assessment & Plan Note (Signed)
 Rechecking labs today. Await results. Treat as needed.

## 2024-05-24 NOTE — Progress Notes (Signed)
 BP (!) 118/90   Pulse (!) 111   Temp 98.3 F (36.8 C) (Oral)   Ht 5' 4 (1.626 m)   Wt 245 lb 3.2 oz (111.2 kg)   SpO2 98%   BMI 42.09 kg/m    Subjective:    Patient ID: Mia Miller, female    DOB: [birth year], 18 y.o.   MRN: [id number]  HPI: Mia Miller is a 18 y.o. female presenting on 05/24/2024 for comprehensive medical examination. Current medical complaints include:  OBESITY- not throwing up, she takes her shots on Friday- last week she threw up (only time in the past 5 months) Duration: chronic Previous attempts at weight loss: as able- developmentally delayed with polyphagia. Has calorie reduced diet at home  Complications of obesity: IFG Peak weight: 267 Weight loss goal: to be healthy Weight loss to date: 22 lbs Requesting obesity pharmacotherapy: yes Current weight loss supplements/medications: yes Previous weight loss supplements/meds: no  ASTHMA Asthma status: stable Satisfied with current treatment?: yes Albuterol /rescue inhaler frequency: several times a week Dyspnea frequency: occasionally Wheezing frequency: occasionally Cough frequency: occasionally Nocturnal symptom frequency: never  Limitation of activity: no Current upper respiratory symptoms: no Aerochamber/spacer use: yes Visits to ER or Urgent Care in past year: no Pneumovax: Not up to Date Influenza: Not up to Date  She currently lives with: mother Menopausal Symptoms: no  Depression Screen done today and results listed below:     11/20/2023   10:06 AM 10/09/2023   10:05 AM 02/21/2023   11:15 AM 10/12/2021    1:38 PM 07/01/2018    3:35 PM  Depression screen PHQ 2/9  Decreased Interest 0 0 0 0 1  Down, Depressed, Hopeless 0 0 0 0 2  PHQ - 2 Score 0 0 0 0 3  Altered sleeping 2 1 2 2 1   Tired, decreased energy 1 1 1  0   Change in appetite 2 2 1 3 1   Feeling bad or failure about yourself  0 0 0 0   Trouble concentrating 2 3 2 3    Moving slowly or fidgety/restless 0 0 0 3    Suicidal thoughts 0 0 0 0   PHQ-9 Score 7 7 6 11 5   Difficult doing work/chores  Very difficult Somewhat difficult      Past Medical History:  Past Medical History:  Diagnosis Date   Acquired adduction deformity of foot, right    ADD (attention deficit disorder)    ADHD    Asthma    Atopic dermatitis    Autism    Chronic constipation    Dental caries    Development delay    Encopresis with constipation and overflow incontinence    Hypothyroid    Mental retardation, mild (I.Q. 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   BLISOVI FE 1/20 1-20 MG-MCG tablet TAKE 1 TABLET BY MOUTH DAILY   clindamycin  (CLEOCIN  T) 1 % external solution Apply topically See admin instructions. APPLY TOPICALLY TO AXILLA ONCE DAILY   clobetasol  (OLUX ) 0.05 % topical foam Apply topically 3 (three) times a week. Apply to scalp 3 times a week Tuesday, Thursday, Saturday at bedtime and leave on overnight, avoid face, groin, axilla   FOCALIN XR 25 MG CP24 Take 1 capsule by mouth daily.   guanFACINE  (INTUNIV ) 4 MG TB24 ER tablet Take 4 mg by mouth daily.   ketoconazole  (NIZORAL ) 2 % cream Apply topically See admin instructions. APPLY 1  APPLICATORFUL TOPICALLY TO NECK AND LEGS DAILY   ketoconazole  (NIZORAL ) 2 % shampoo Apply 1 Application topically 3 (three) times a week. Wash scalp 3 times weekly, Monday, Wednesday, Friday, let sit 10 minutes and rinse out   mometasone  (ELOCON ) 0.1 % lotion APPLY TOPICALLY THREE TIMES A WEEK TO SCALP ON MONDAY, WEDNESDAY AND FRIDAYS   nystatin  cream (MYCOSTATIN ) Apply topically 2 (two) times daily.   nystatin  powder Apply topically 3 (three) times daily.   SODIUM FLUORIDE 5000 PPM 1.1 % PSTE Brush to teeth once or twice daily. Do not rinse after use. Do not eat or drink for 30 minutes after use.   Spacer/Aero-Holding Chambers DEVI 1 each by Does not apply route as needed.   traZODone (DESYREL) 50 MG tablet Take 50-100 mg by mouth at bedtime.   cholecalciferol (VITAMIN D3) 25 MCG (1000 UNIT) tablet Take 1,000 Units by mouth daily.   doxycycline  (VIBRAMYCIN ) 50 MG capsule TAKE 1 CAPSULE BY MOUTH ONCE A DAY WITH DINNER( TAKE WITH FOOD AND DRINK)   Vitamin D , Ergocalciferol , (DRISDOL ) 1.25 MG (50000 UNIT) CAPS capsule Take 1 capsule (50,000 Units total) by mouth every 7 (seven) days.   Current Facility-Administered Medications on File Prior to Visit  Medication   cyanocobalamin  (VITAMIN B12) injection 1,000 mcg    Allergies:  Allergies  Allergen Reactions   Amoxicillin Diarrhea   Lansoprazole Diarrhea   Loratadine Other (See Comments)    Dried out very bad    Social History:  Social History   Socioeconomic History   Marital status: Single    Spouse name: Not on file   Number of children: Not on file   Years of education: Not on file   Highest education level: 9th grade  Occupational History   Not on file  Tobacco Use   Smoking status: Never    Passive exposure: Yes   Smokeless tobacco: Never   Tobacco comments:    inside smoking  Vaping Use   Vaping status: Never Used  Substance and Sexual Activity   Alcohol use: No   Drug use: No   Sexual activity: Never  Other Topics  Concern   Not on file  Social History Narrative   Lives with her mother, maternal grandmother, cousins, aunt, half brother.    She enjoys walking and going to the park.    Attends [employer] as a rising 9th grader   Social Drivers of Health   Financial Resource Strain: Medium Risk (01/02/2024)   Overall Financial Resource Strain (CARDIA)    Difficulty of Paying Living Expenses: Somewhat hard  Food Insecurity: Food Insecurity Present (01/02/2024)   Hunger  Vital Sign    Worried About Programme researcher, broadcasting/film/video in the Last Year: Sometimes true    Ran Out of Food in the Last Year: Sometimes true  Transportation Needs: Unmet Transportation Needs (01/02/2024)   PRAPARE - Transportation    Lack of Transportation (Medical): Yes    Lack of Transportation (Non-Medical): Yes  Physical Activity: Sufficiently Active (01/02/2024)   Exercise Vital Sign    Days of Exercise per Week: 5 days    Minutes of Exercise per Session: 100 min  Stress: No Stress Concern Present (01/02/2024)   [employer] of Occupational Health - Occupational Stress Questionnaire    Feeling of Stress : Only a little  Social Connections: Socially Isolated (01/02/2024)   Social Connection and Isolation Panel    Frequency of Communication with Friends and Family: Never    Frequency of Social Gatherings with Friends and Family: More than three times a week    Attends Religious Services: Never    Database administrator or Organizations: No    Attends Engineer, structural: Not on file    Marital Status: Never married  Intimate Partner Violence: Unknown (01/07/2022)   Received from [facility]   HITS    Physically Hurt: Not on file    Insult or Talk Down To: Not on file    Threaten Physical Harm: Not on file    Scream or Curse: Not on file   Social History   Tobacco Use  Smoking Status Never   Passive exposure: Yes  Smokeless Tobacco Never  Tobacco Comments   inside smoking   Social History   Substance and  Sexual Activity  Alcohol Use No    Family History:  Family History  Problem Relation Age of Onset   Anxiety disorder Mother    Depression Mother    Migraines Mother    Bipolar disorder Mother    Seizures Maternal Aunt    Migraines Maternal Aunt    ADD / ADHD Maternal Aunt    ADD / ADHD Maternal Aunt    Mental illness Maternal Grandmother    Stroke Maternal Grandmother    Thyroid  disease Maternal Grandmother    Heart disease Maternal Grandmother    Migraines Maternal Grandmother    Hypertension Maternal Grandfather    COPD Maternal Grandfather    Heart disease Maternal Grandfather    Seizures Maternal Grandfather    ADD / ADHD Cousin        Strong Mfhx of ADHD   Apraxia Cousin        Maternal 1 st cousin   Autism Cousin        Maternal 1 st cousin   Cancer Neg Hx    Diabetes Neg Hx     Past medical history, surgical history, medications, allergies, family history and social history reviewed with patient today and changes made to appropriate areas of the chart.   Review of Systems  Constitutional: Negative.   HENT: Negative.    Eyes: Negative.   Respiratory: Negative.    Cardiovascular: Negative.   Gastrointestinal:  Positive for diarrhea and vomiting. Negative for abdominal pain, blood in stool, constipation, heartburn, melena and nausea.       Smacking her stomach  Genitourinary: Negative.   Musculoskeletal:  Positive for back pain. Negative for falls, joint pain, myalgias and neck pain.  Skin:  Negative for itching and rash.       +acne  Neurological: Negative.   Endo/Heme/Allergies:  Positive for environmental allergies. Negative  for polydipsia. Does not bruise/bleed easily.  Psychiatric/Behavioral: Negative.     All other ROS negative except what is listed above and in the HPI.      Objective:    BP (!) 118/90   Pulse (!) 111   Temp 98.3 F (36.8 C) (Oral)   Ht 5' 4 (1.626 m)   Wt 245 lb 3.2 oz (111.2 kg)   SpO2 98%   BMI 42.09 kg/m   Wt Readings  from Last 3 Encounters:  05/24/24 245 lb 3.2 oz (111.2 kg) (>99%, Z= 2.44)*  01/05/24 248 lb (112.5 kg) (>99%, Z= 2.45)*  11/20/23 253 lb 6.4 oz (114.9 kg) (>99%, Z= 2.49)*   * Growth percentiles are based on CDC (Girls, 2-20 Years) data.    Physical Exam Vitals and nursing note reviewed.  Constitutional:      General: She is not in acute distress.    Appearance: Normal appearance. She is obese. She is not ill-appearing, toxic-appearing or diaphoretic.  HENT:     Head: Normocephalic and atraumatic.     Right Ear: Tympanic membrane, ear canal and external ear normal. There is no impacted cerumen.     Left Ear: Tympanic membrane, ear canal and external ear normal. There is no impacted cerumen.     Nose: Nose normal. No congestion or rhinorrhea.     Mouth/Throat:     Mouth: Mucous membranes are moist.     Pharynx: Oropharynx is clear. No oropharyngeal exudate or posterior oropharyngeal erythema.  Eyes:     General: No scleral icterus.       Right eye: No discharge.        Left eye: No discharge.     Extraocular Movements: Extraocular movements intact.     Conjunctiva/sclera: Conjunctivae normal.     Pupils: Pupils are equal, round, and reactive to light.  Neck:     Vascular: No carotid bruit.  Cardiovascular:     Rate and Rhythm: Normal rate and regular rhythm.     Pulses: Normal pulses.     Heart sounds: No murmur heard.    No friction rub. No gallop.  Pulmonary:     Effort: Pulmonary effort is normal. No respiratory distress.     Breath sounds: Normal breath sounds. No stridor. No wheezing, rhonchi or rales.  Chest:     Chest wall: No tenderness.  Abdominal:     General: Abdomen is flat. Bowel sounds are normal. There is no distension.     Palpations: Abdomen is soft. There is no mass.     Tenderness: There is no abdominal tenderness. There is no right CVA tenderness, left CVA tenderness, guarding or rebound.     Hernia: No hernia is present.  Genitourinary:    Comments:  Breast and pelvic exams deferred with shared decision making Musculoskeletal:        General: No swelling, tenderness, deformity or signs of injury.     Cervical back: Normal range of motion and neck supple. No rigidity. No muscular tenderness.     Right lower leg: No edema.     Left lower leg: No edema.  Lymphadenopathy:     Cervical: No cervical adenopathy.  Skin:    General: Skin is warm and dry.     Capillary Refill: Capillary refill takes less than 2 seconds.     Coloration: Skin is not jaundiced or pale.     Findings: No bruising, erythema, lesion or rash.  Neurological:     General: No focal  deficit present.     Mental Status: She is alert and oriented to person, place, and time. Mental status is at baseline.     Cranial Nerves: No cranial nerve deficit.     Sensory: No sensory deficit.     Motor: No weakness.     Coordination: Coordination normal.     Gait: Gait normal.     Deep Tendon Reflexes: Reflexes normal.  Psychiatric:        Mood and Affect: Mood normal.        Behavior: Behavior normal.        Thought Content: Thought content normal.        Judgment: Judgment normal.     Results for orders placed or performed in visit on 01/05/24  Bayer DCA Hb A1c Waived   Collection Time: 01/05/24 10:10 AM  Result Value Ref Range   HB A1C (BAYER DCA - WAIVED) 5.3 4.8 - 5.6 %      Assessment & Plan:   Problem List Items Addressed This Visit       Respiratory   Mild intermittent asthma without complication   Under good control on current regimen. Continue current regimen. Continue to monitor. Call with any concerns. Refills given. Tolerating her nebulizer well. Needs refill- will get them for her.        Relevant Medications   budesonide -formoterol  (SYMBICORT ) 80-4.5 MCG/ACT inhaler   albuterol  (VENTOLIN  HFA) 108 (90 Base) MCG/ACT inhaler     Endocrine   IFG (impaired fasting glucose)   Rechecking labs today. Await results. Treat as needed.        Other    Vitamin D  deficiency   Rechecking labs today. Await results. Treat as needed.      Morbid obesity (HCC)   Tolerating medicine well. Down 22lbs, 8.2% of her weight. Will increase her wegovy  to 1.7mg  and recheck in 3 months. Call with any concerns.       Relevant Medications   metFORMIN  (GLUCOPHAGE ) 500 MG tablet   semaglutide -weight management (WEGOVY ) 1.7 MG/0.75ML SOAJ SQ injection   B12 deficiency   Rechecking labs today. Await results. Treat as needed.      Other Visit Diagnoses       Routine general medical examination at a health care facility    -  Primary   Vaccines up to date. Screening labs checked today. Continue diet and exercise. Call with any concerns.   Relevant Orders   Bayer DCA Hb A1c Waived   CBC with Differential/Platelet   Comprehensive metabolic panel with GFR   Lipid Panel w/o Chol/HDL Ratio   TSH   VITAMIN D  25 Hydroxy (Vit-D Deficiency, Fractures)   B12     Dyspepsia       Relevant Medications   omeprazole  (PRILOSEC) 20 MG capsule        Follow up plan: Return in about 3 months (around 08/24/2024).   LABORATORY TESTING:  - Pap smear: not applicable  IMMUNIZATIONS:   - Tdap: Tetanus vaccination status reviewed: last tetanus booster within 10 years. - Influenza: Postponed to flu season - Prevnar: will get next visit - COVID: Refused - HPV: Administered today - Meningitis: Administered today  PATIENT COUNSELING:   Advised to take 1 mg of folate supplement per day if capable of pregnancy.   Sexuality: Discussed sexually transmitted diseases, partner selection, use of condoms, avoidance of unintended pregnancy  and contraceptive alternatives.   Advised to avoid cigarette smoking.  I discussed with the patient that most people  either abstain from alcohol or drink within safe limits (<=14/week and <=4 drinks/occasion for males, <=7/weeks and <= 3 drinks/occasion for females) and that the risk for alcohol disorders and other health effects  rises proportionally with the number of drinks per week and how often a drinker exceeds daily limits.  Discussed cessation/primary prevention of drug use and availability of treatment for abuse.   Diet: Encouraged to adjust caloric intake to maintain  or achieve ideal body weight, to reduce intake of dietary saturated fat and total fat, to limit sodium intake by avoiding high sodium foods and not adding table salt, and to maintain adequate dietary potassium and calcium preferably from fresh fruits, vegetables, and low-fat dairy products.    stressed the importance of regular exercise  Injury prevention: Discussed safety belts, safety helmets, smoke detector, smoking near bedding or upholstery.   Dental health: Discussed importance of regular tooth brushing, flossing, and dental visits.    NEXT PREVENTATIVE PHYSICAL DUE IN 1 YEAR. Return in about 3 months (around 08/24/2024).

## 2024-05-24 NOTE — Assessment & Plan Note (Signed)
 Under good control on current regimen. Continue current regimen. Continue to monitor. Call with any concerns. Refills given. Tolerating her nebulizer well. Needs refill- will get them for her.

## 2024-05-24 NOTE — Assessment & Plan Note (Signed)
 Tolerating medicine well. Down 22lbs, 8.2% of her weight. Will increase her wegovy  to 1.7mg  and recheck in 3 months. Call with any concerns.

## 2024-05-25 ENCOUNTER — Telehealth: Payer: Self-pay | Admitting: Family Medicine

## 2024-05-25 LAB — CBC WITH DIFFERENTIAL/PLATELET
Basophils Absolute: 0.1 x10E3/uL (ref 0.0–0.2)
Basos: 1 %
EOS (ABSOLUTE): 0.1 x10E3/uL (ref 0.0–0.4)
Eos: 1 %
Hematocrit: 35.9 % (ref 34.0–46.6)
Hemoglobin: 11.5 g/dL (ref 11.1–15.9)
Immature Grans (Abs): 0 x10E3/uL (ref 0.0–0.1)
Immature Granulocytes: 0 %
Lymphocytes Absolute: 3.7 x10E3/uL — ABNORMAL HIGH (ref 0.7–3.1)
Lymphs: 27 %
MCH: 27.1 pg (ref 26.6–33.0)
MCHC: 32 g/dL (ref 31.5–35.7)
MCV: 85 fL (ref 79–97)
Monocytes Absolute: 0.6 x10E3/uL (ref 0.1–0.9)
Monocytes: 5 %
Neutrophils Absolute: 9.4 x10E3/uL — ABNORMAL HIGH (ref 1.4–7.0)
Neutrophils: 66 %
Platelets: 441 x10E3/uL (ref 150–450)
RBC: 4.24 x10E6/uL (ref 3.77–5.28)
RDW: 14.5 % (ref 11.7–15.4)
WBC: 13.9 x10E3/uL — ABNORMAL HIGH (ref 3.4–10.8)

## 2024-05-25 LAB — LIPID PANEL W/O CHOL/HDL RATIO
Cholesterol, Total: 159 mg/dL (ref 100–169)
HDL: 44 mg/dL (ref >39)
LDL Chol Calc (NIH): 94 mg/dL (ref 0–109)
Triglycerides: 114 mg/dL — ABNORMAL HIGH (ref 0–89)
VLDL Cholesterol Cal: 21 mg/dL (ref 5–40)

## 2024-05-25 LAB — COMPREHENSIVE METABOLIC PANEL WITH GFR
ALT: 18 IU/L (ref 0–32)
AST: 20 IU/L (ref 0–40)
Albumin: 4.5 g/dL (ref 4.0–5.0)
Alkaline Phosphatase: 93 IU/L (ref 42–106)
BUN/Creatinine Ratio: 13 (ref 9–23)
BUN: 9 mg/dL (ref 6–20)
Bilirubin Total: <0.2 mg/dL (ref 0.0–1.2)
CO2: 19 mmol/L — ABNORMAL LOW (ref 20–29)
Calcium: 9.9 mg/dL (ref 8.7–10.2)
Chloride: 102 mmol/L (ref 96–106)
Creatinine, Ser: 0.72 mg/dL (ref 0.57–1.00)
Globulin, Total: 3 g/dL (ref 1.5–4.5)
Glucose: 85 mg/dL (ref 70–99)
Potassium: 4.4 mmol/L (ref 3.5–5.2)
Sodium: 140 mmol/L (ref 134–144)
Total Protein: 7.5 g/dL (ref 6.0–8.5)
eGFR: 123 mL/min/1.73 (ref >59)

## 2024-05-25 LAB — TSH: TSH: 6.16 u[IU]/mL — ABNORMAL HIGH (ref 0.450–4.500)

## 2024-05-25 LAB — VITAMIN D 25 HYDROXY (VIT D DEFICIENCY, FRACTURES): Vit D, 25-Hydroxy: 49.6 ng/mL (ref 30.0–100.0)

## 2024-05-25 LAB — VITAMIN B12: Vitamin B-12: 278 pg/mL (ref 232–1245)

## 2024-05-25 LAB — BAYER DCA HB A1C WAIVED: HB A1C (BAYER DCA - WAIVED): 4.9 % (ref 4.8–5.6)

## 2024-05-25 NOTE — Telephone Encounter (Signed)
 Copied from CRM [id number]. Topic: General - Other >> May 25, 2024  1:30 PM Wess RAMAN wrote: Reason for CRM: Dr. Redell Core, psychiatrist from [facility] would like a call from Dr. Vicci, Mia Miller to discuss patient's chronic constipation, nausea, abdominal pain, and behavioral issues. He would like to know if pain is a factor of the behavioral issues.  Callback #: [phone] Psychologist, sport and exercise Phone)

## 2024-05-25 NOTE — Telephone Encounter (Signed)
 There is a release on file between us  and [place]. Forwarding to PCP for review.

## 2024-06-03 ENCOUNTER — Ambulatory Visit: Payer: Self-pay | Admitting: Family Medicine

## 2024-06-04 NOTE — Telephone Encounter (Signed)
 Called and spoke with psychiatrist- concern that GI symptoms are causing some of her behaviors. Will hold on increasing her wegovy  and monitor.

## 2024-06-08 ENCOUNTER — Ambulatory Visit (INDEPENDENT_AMBULATORY_CARE_PROVIDER_SITE_OTHER): Payer: MEDICAID | Admitting: Nurse Practitioner

## 2024-06-08 ENCOUNTER — Ambulatory Visit: Payer: Self-pay | Admitting: *Deleted

## 2024-06-08 ENCOUNTER — Encounter: Payer: Self-pay | Admitting: Nurse Practitioner

## 2024-06-08 VITALS — HR 89 | Temp 98.6°F | Ht 64.0 in | Wt 245.0 lb

## 2024-06-08 DIAGNOSIS — M25571 Pain in right ankle and joints of right foot: Secondary | ICD-10-CM | POA: Diagnosis not present

## 2024-06-08 NOTE — Progress Notes (Signed)
 Pulse 89   Temp 98.6 F (37 C) (Oral)   Ht 5' 4 (1.626 m)   Wt 245 lb (111.1 kg)   SpO2 98%   BMI 42.05 kg/m    Subjective:    Patient ID: Mia Miller, female    DOB: 09-29-2006, 18 y.o.   MRN: [id number]  HPI: Mia Miller is a 18 y.o. female  Chief Complaint  Patient presents with   Foot Pain    Patient's mother states that the patient slipped down 3 stairs this morning. States that the patient has been complaining of being in a lot of pain since the accident.    Patient is accompanied by mom and mom states that the patient was wearing her aunt's shoes that were too big and she slid when she went down the stairs.  Family said that her foot just slid out from under her.  Patient was crying and couldn't move. Mom states it is not unusual for patient to get hurt and not cry but she was really crying this morning and mom had to carry her.  She isn't able to put any weight on the foot.  States her calf is swelling. Mom gave her ibuprofen  400mg  today.     Relevant past medical, surgical, family and social history reviewed and updated as indicated. Interim medical history since our last visit reviewed. Allergies and medications reviewed and updated.  Review of Systems  Musculoskeletal:        Ankle pain    Per HPI unless specifically indicated above     Objective:    Pulse 89   Temp 98.6 F (37 C) (Oral)   Ht 5' 4 (1.626 m)   Wt 245 lb (111.1 kg)   SpO2 98%   BMI 42.05 kg/m   Wt Readings from Last 3 Encounters:  06/08/24 245 lb (111.1 kg) (>99%, Z= 2.44)*  05/24/24 245 lb 3.2 oz (111.2 kg) (>99%, Z= 2.44)*  01/05/24 248 lb (112.5 kg) (>99%, Z= 2.45)*   * Growth percentiles are based on CDC (Girls, 2-20 Years) data.    Physical Exam Vitals and nursing note reviewed.  Constitutional:      General: She is not in acute distress.    Appearance: Normal appearance. She is not ill-appearing, toxic-appearing or diaphoretic.  HENT:     Head: Normocephalic.      Right Ear: External ear normal.     Left Ear: External ear normal.     Nose: Nose normal.     Mouth/Throat:     Mouth: Mucous membranes are moist.     Pharynx: Oropharynx is clear.  Eyes:     General:        Right eye: No discharge.        Left eye: No discharge.     Extraocular Movements: Extraocular movements intact.     Conjunctiva/sclera: Conjunctivae normal.     Pupils: Pupils are equal, round, and reactive to light.  Cardiovascular:     Rate and Rhythm: Normal rate and regular rhythm.     Heart sounds: No murmur heard. Pulmonary:     Effort: Pulmonary effort is normal. No respiratory distress.     Breath sounds: Normal breath sounds. No wheezing or rales.  Musculoskeletal:     Cervical back: Normal range of motion and neck supple.     Right ankle: Normal.     Left ankle: Normal.  Skin:    General: Skin is warm and dry.  Capillary Refill: Capillary refill takes less than 2 seconds.  Neurological:     General: No focal deficit present.     Mental Status: She is alert and oriented to person, place, and time. Mental status is at baseline.  Psychiatric:        Mood and Affect: Mood normal.        Behavior: Behavior normal.        Thought Content: Thought content normal.        Judgment: Judgment normal.     Results for orders placed or performed in visit on 05/24/24  Bayer DCA Hb A1c Waived   Collection Time: 05/24/24  4:45 PM  Result Value Ref Range   HB A1C (BAYER DCA - WAIVED) 4.9 4.8 - 5.6 %  CBC with Differential/Platelet   Collection Time: 05/24/24  4:47 PM  Result Value Ref Range   WBC 13.9 (H) 3.4 - 10.8 x10E3/uL   RBC 4.24 3.77 - 5.28 x10E6/uL   Hemoglobin 11.5 11.1 - 15.9 g/dL   Hematocrit 64.0 65.9 - 46.6 %   MCV 85 79 - 97 fL   MCH 27.1 26.6 - 33.0 pg   MCHC 32.0 31.5 - 35.7 g/dL   RDW 85.4 88.2 - 84.5 %   Platelets 441 150 - 450 x10E3/uL   Neutrophils 66 Not Estab. %   Lymphs 27 Not Estab. %   Monocytes 5 Not Estab. %   Eos 1 Not Estab. %    Basos 1 Not Estab. %   Neutrophils Absolute 9.4 (H) 1.4 - 7.0 x10E3/uL   Lymphocytes Absolute 3.7 (H) 0.7 - 3.1 x10E3/uL   Monocytes Absolute 0.6 0.1 - 0.9 x10E3/uL   EOS (ABSOLUTE) 0.1 0.0 - 0.4 x10E3/uL   Basophils Absolute 0.1 0.0 - 0.2 x10E3/uL   Immature Granulocytes 0 Not Estab. %   Immature Grans (Abs) 0.0 0.0 - 0.1 x10E3/uL  Comprehensive metabolic panel with GFR   Collection Time: 05/24/24  4:47 PM  Result Value Ref Range   Glucose 85 70 - 99 mg/dL   BUN 9 6 - 20 mg/dL   Creatinine, Ser 9.27 0.57 - 1.00 mg/dL   eGFR 876 >40 fO/fpw/8.26   BUN/Creatinine Ratio 13 9 - 23   Sodium 140 134 - 144 mmol/L   Potassium 4.4 3.5 - 5.2 mmol/L   Chloride 102 96 - 106 mmol/L   CO2 19 (L) 20 - 29 mmol/L   Calcium 9.9 8.7 - 10.2 mg/dL   Total Protein 7.5 6.0 - 8.5 g/dL   Albumin 4.5 4.0 - 5.0 g/dL   Globulin, Total 3.0 1.5 - 4.5 g/dL   Bilirubin Total <9.7 0.0 - 1.2 mg/dL   Alkaline Phosphatase 93 42 - 106 IU/L   AST 20 0 - 40 IU/L   ALT 18 0 - 32 IU/L  Lipid Panel w/o Chol/HDL Ratio   Collection Time: 05/24/24  4:47 PM  Result Value Ref Range   Cholesterol, Total 159 100 - 169 mg/dL   Triglycerides 885 (H) 0 - 89 mg/dL   HDL 44 >60 mg/dL   VLDL Cholesterol Cal 21 5 - 40 mg/dL   LDL Chol Calc (NIH) 94 0 - 109 mg/dL  TSH   Collection Time: 05/24/24  4:47 PM  Result Value Ref Range   TSH 6.160 (H) 0.450 - 4.500 uIU/mL  VITAMIN D  25 Hydroxy (Vit-D Deficiency, Fractures)   Collection Time: 05/24/24  4:47 PM  Result Value Ref Range   Vit D, 25-Hydroxy 49.6 30.0 -  100.0 ng/mL  B12   Collection Time: 05/24/24  4:47 PM  Result Value Ref Range   Vitamin B-12 278 232 - 1,245 pg/mL      Assessment & Plan:   Problem List Items Addressed This Visit   None Visit Diagnoses       Acute right ankle pain    -  Primary   Recommend obtaining xray. Continue with Ibuprofen .  Recommend elevated and icing at home.  Follow up if not improved.   Relevant Orders   DG Ankle Complete Right         Follow up plan: No follow-ups on file.

## 2024-06-08 NOTE — Telephone Encounter (Signed)
 Copied from CRM [id number]. Topic: Clinical - Red Word Triage >> Jun 08, 2024 12:22 PM Tiffany S wrote: Red Word that prompted transfer to Nurse Triage: Patient had a fall pain in right foot not able to walk Reason for Disposition  [1] SEVERE pain (e.g., excruciating, unable to do any normal activities) AND [2] not improved after 2 hours of pain medicine  Answer Assessment - Initial Assessment Questions 1. ONSET: When did the pain start?      Mother Andree Pinal calling in.    She fell and hurt her right foot a while back.   Today she hurt it worse than ever.  She can't walk.   She was wearing my sister's shoes which are too big for her.  She was going down the steps and sled down 3 steps and twisted her foot.   She was crying.   She is in bad pain.   I gave her 200 mg ibuprofen .    2. LOCATION: Where is the pain located?      Right foot.     She is doing ice and heat back and forth.    She won't let me do anything with it.   3. PAIN: How bad is the pain?    (Scale 1-10; or mild, moderate, severe)     Severe She is autistic.   It's hard to keep her there.   I will need a wheelchair to get her out of the door.   She will try to run out.   4. WORK OR EXERCISE: Has there been any recent work or exercise that involved this part of the body?      See above 5. CAUSE: What do you think is causing the foot pain?     She fell 6. OTHER SYMPTOMS: Do you have any other symptoms? (e.g., leg pain, rash, fever, numbness)     It's swelling up a little now.     7. PREGNANCY: Is there any chance you are pregnant? When was your last menstrual period?     Not asked  Protocols used: Foot Pain-A-AH FYI Only or Action Required?: FYI only for provider.  Patient was last seen in primary care on 05/24/2024 by Vicci Duwaine SQUIBB, DO. Pt will need a wheelchair in order to get to the office from the car.   Advised her to call when arrived and to arrive about 2:15 in order to have time to get a wheelchair and  get up to the office in time for the appt.    Called Nurse Triage reporting Foot Pain.Right foot pain.  Slid down 3 steps this morning.  Can't walk.  Twisted it.    Symptoms began today.  Interventions attempted: OTC medications: ibuprofen  and Ice/heat application.  Symptoms are: rapidly worsening.  Triage Disposition: See HCP Within 4 Hours (Or PCP Triage)  Patient/caregiver understands and will follow disposition?: Yes

## 2024-06-16 ENCOUNTER — Other Ambulatory Visit: Payer: Self-pay | Admitting: Family Medicine

## 2024-06-17 NOTE — Telephone Encounter (Signed)
 Requested Prescriptions  Pending Prescriptions Disp Refills   albuterol  (PROVENTIL ) (2.5 MG/3ML) 0.083% nebulizer solution [Pharmacy Med Name: ALBUTEROL  SULFATE (2.5 MG/3ML) 0.08] 150 mL 2    Sig: USE 1 VIAL VIA NEBULIZER EVERY 6 HOURS AS NEEDED FOR WHEEZING OR SHORTNESS OF BREATH     Pulmonology:  Beta Agonists 2 Failed - 06/17/2024  2:28 PM      Failed - Last BP in normal range    BP Readings from Last 1 Encounters:  05/24/24 (!) 118/90         Passed - Last Heart Rate in normal range    Pulse Readings from Last 1 Encounters:  06/08/24 89         Passed - Valid encounter within last 12 months    Recent Outpatient Visits           1 week ago Acute right ankle pain   [facility] Melvin Pao, NP   3 weeks ago Routine general medical examination at a health care facility   [facility], Megan P, DO   5 months ago Morbid obesity [facility])   [facility] [place], Megan P, DO   7 months ago Morbid obesity [facility])   [facility] Vicci Duwaine SQUIBB, DO       Future Appointments             In 5 months Hester Alm BROCKS, MD [facility]

## 2024-07-16 ENCOUNTER — Telehealth: Payer: Self-pay

## 2024-07-16 ENCOUNTER — Other Ambulatory Visit: Payer: Self-pay | Admitting: Family Medicine

## 2024-07-16 NOTE — Telephone Encounter (Signed)
 Copied from CRM [id number]. Topic: Clinical - Prescription Issue >> Jul 16, 2024  1:14 PM Montie POUR wrote: Reason for CRM:  Mom, Andree, said that insurance would not pay for semaglutide -weight management (WEGOVY ) 1.7 MG/0.75ML SOAJ SQ injection going forward. Please call Andree at [phone] to discuss what to do about this medication. Also, if there is something else she could take that insurance will cover.

## 2024-07-16 NOTE — Telephone Encounter (Signed)
 Would there be anything else that the patient could take?

## 2024-07-19 NOTE — Telephone Encounter (Signed)
 Called patient and left a message for her to call back to get scheduled.

## 2024-07-19 NOTE — Telephone Encounter (Signed)
 Requested medications are due for refill today.  yes  Requested medications are on the active medications list.  yes  Last refill. 03/16/2024 30 g 0 rf  Future visit scheduled.   yes  Notes to clinic.  Refill not delegated.    Requested Prescriptions  Pending Prescriptions Disp Refills   nystatin  cream (MYCOSTATIN ) [Pharmacy Med Name: NYSTATIN  100000 UNIT/GM TOP CREAM G] 30 g 0    Sig: APPLY TO AFFECTED AREA(s) TWICE DAILY     Off-Protocol Failed - 07/19/2024  4:34 PM      Failed - Medication not assigned to a protocol, review manually.      Passed - Valid encounter within last 12 months    Recent Outpatient Visits           1 month ago Acute right ankle pain   [facility] Melvin Pao, NP   1 month ago Routine general medical examination at a health care facility   [facility], Megan P, DO   6 months ago Morbid obesity [facility])   [facility] [place], Megan P, DO   8 months ago Morbid obesity [facility])   [facility] Vicci Duwaine SQUIBB, DO       Future Appointments             In 4 months Hester Alm BROCKS, MD [facility]

## 2024-07-19 NOTE — Telephone Encounter (Signed)
 appt

## 2024-07-21 ENCOUNTER — Encounter: Payer: Self-pay | Admitting: Dermatology

## 2024-07-21 NOTE — Telephone Encounter (Signed)
 Scheduled

## 2024-08-03 ENCOUNTER — Encounter: Payer: Self-pay | Admitting: Family Medicine

## 2024-08-03 ENCOUNTER — Ambulatory Visit: Payer: MEDICAID | Admitting: Family Medicine

## 2024-08-03 DIAGNOSIS — Z23 Encounter for immunization: Secondary | ICD-10-CM

## 2024-08-03 DIAGNOSIS — R0683 Snoring: Secondary | ICD-10-CM | POA: Diagnosis not present

## 2024-08-03 DIAGNOSIS — R7301 Impaired fasting glucose: Secondary | ICD-10-CM

## 2024-08-03 MED ORDER — METFORMIN HCL 500 MG PO TABS: 1000.0000 mg | ORAL_TABLET | Freq: Two times a day (BID) | ORAL | 1 refills | Status: AC

## 2024-08-03 NOTE — Assessment & Plan Note (Signed)
 Wegovy  not covered by her insurance anymore. Will try to get it covered due to her co-morbidities and will check for OSA given snoring. Follow up 2-3 months. Will increase her metformin  to 1000mg  BID to avoid weight regain. Call with any concerns.

## 2024-08-03 NOTE — Progress Notes (Signed)
 Temp 98.3 F (36.8 C) (Oral)   Ht 5' 4 (1.626 m)   Wt 234 lb 3.2 oz (106.2 kg)   SpO2 99%   BMI 40.20 kg/m    Subjective:    Patient ID: Mia Miller, female    DOB: 04-18-2006, 18 y.o.   MRN: [id number]  HPI: Mia Miller is a 18 y.o. female  Chief Complaint  Patient presents with   Obesity   OBESITY Duration: chronic Previous attempts at weight loss: as able- developmentally delayed with polyphagia. Has calorie reduced diet at home  Complications of obesity: IFG Peak weight: 267 Weight loss goal: to be healthy Weight loss to date: 34 lbs Requesting obesity pharmacotherapy: yes Current weight loss supplements/medications: yes Previous weight loss supplements/meds: no  ???SLEEP APNEA Sleep apnea status: unknown Duration: chronic Last sleep study: never Wakes feeling refreshed:  yes Daytime hypersomnolence:  yes Fatigue:  yes Insomnia:  yes Good sleep hygiene:  no Difficulty falling asleep:  no Difficulty staying asleep:  no Snoring bothers bed partner:  yes Observed apnea by bed partner: yes Obesity:  yes Hypertension: no  Pulmonary hypertension:  no Coronary artery disease:  no  Relevant past medical, surgical, family and social history reviewed and updated as indicated. Interim medical history since our last visit reviewed. Allergies and medications reviewed and updated.  Review of Systems  Constitutional: Negative.   Respiratory: Negative.    Cardiovascular: Negative.   Gastrointestinal: Negative.   Genitourinary: Negative.   Musculoskeletal: Negative.   Neurological: Negative.   Psychiatric/Behavioral: Negative.      Per HPI unless specifically indicated above     Objective:    Temp 98.3 F (36.8 C) (Oral)   Ht 5' 4 (1.626 m)   Wt 234 lb 3.2 oz (106.2 kg)   SpO2 99%   BMI 40.20 kg/m   Wt Readings from Last 3 Encounters:  08/03/24 234 lb 3.2 oz (106.2 kg) (>99%, Z= 2.35)*  06/08/24 245 lb (111.1 kg) (>99%, Z= 2.44)*  05/24/24  245 lb 3.2 oz (111.2 kg) (>99%, Z= 2.44)*   * Growth percentiles are based on CDC (Girls, 2-20 Years) data.    Physical Exam Vitals and nursing note reviewed.  Constitutional:      General: She is not in acute distress.    Appearance: Normal appearance. She is obese. She is not ill-appearing, toxic-appearing or diaphoretic.  HENT:     Head: Normocephalic and atraumatic.     Right Ear: External ear normal.     Left Ear: External ear normal.     Nose: Nose normal.     Mouth/Throat:     Mouth: Mucous membranes are moist.     Pharynx: Oropharynx is clear.  Eyes:     General: No scleral icterus.       Right eye: No discharge.        Left eye: No discharge.     Extraocular Movements: Extraocular movements intact.     Conjunctiva/sclera: Conjunctivae normal.     Pupils: Pupils are equal, round, and reactive to light.  Cardiovascular:     Rate and Rhythm: Normal rate and regular rhythm.     Pulses: Normal pulses.     Heart sounds: Normal heart sounds. No murmur heard.    No friction rub. No gallop.  Pulmonary:     Effort: Pulmonary effort is normal. No respiratory distress.     Breath sounds: Normal breath sounds. No stridor. No wheezing, rhonchi or rales.  Chest:  Chest wall: No tenderness.  Musculoskeletal:        General: Normal range of motion.     Cervical back: Normal range of motion and neck supple.  Skin:    General: Skin is warm and dry.     Capillary Refill: Capillary refill takes less than 2 seconds.     Coloration: Skin is not jaundiced or pale.     Findings: No bruising, erythema, lesion or rash.  Neurological:     General: No focal deficit present.     Mental Status: She is alert. Mental status is at baseline.  Psychiatric:        Mood and Affect: Mood normal.        Behavior: Behavior normal.     Results for orders placed or performed in visit on 05/24/24  Bayer DCA Hb A1c Waived   Collection Time: 05/24/24  4:45 PM  Result Value Ref Range   HB A1C  (BAYER DCA - WAIVED) 4.9 4.8 - 5.6 %  CBC with Differential/Platelet   Collection Time: 05/24/24  4:47 PM  Result Value Ref Range   WBC 13.9 (H) 3.4 - 10.8 x10E3/uL   RBC 4.24 3.77 - 5.28 x10E6/uL   Hemoglobin 11.5 11.1 - 15.9 g/dL   Hematocrit 64.0 65.9 - 46.6 %   MCV 85 79 - 97 fL   MCH 27.1 26.6 - 33.0 pg   MCHC 32.0 31.5 - 35.7 g/dL   RDW 85.4 88.2 - 84.5 %   Platelets 441 150 - 450 x10E3/uL   Neutrophils 66 Not Estab. %   Lymphs 27 Not Estab. %   Monocytes 5 Not Estab. %   Eos 1 Not Estab. %   Basos 1 Not Estab. %   Neutrophils Absolute 9.4 (H) 1.4 - 7.0 x10E3/uL   Lymphocytes Absolute 3.7 (H) 0.7 - 3.1 x10E3/uL   Monocytes Absolute 0.6 0.1 - 0.9 x10E3/uL   EOS (ABSOLUTE) 0.1 0.0 - 0.4 x10E3/uL   Basophils Absolute 0.1 0.0 - 0.2 x10E3/uL   Immature Granulocytes 0 Not Estab. %   Immature Grans (Abs) 0.0 0.0 - 0.1 x10E3/uL  Comprehensive metabolic panel with GFR   Collection Time: 05/24/24  4:47 PM  Result Value Ref Range   Glucose 85 70 - 99 mg/dL   BUN 9 6 - 20 mg/dL   Creatinine, Ser 9.27 0.57 - 1.00 mg/dL   eGFR 876 >40 fO/fpw/8.26   BUN/Creatinine Ratio 13 9 - 23   Sodium 140 134 - 144 mmol/L   Potassium 4.4 3.5 - 5.2 mmol/L   Chloride 102 96 - 106 mmol/L   CO2 19 (L) 20 - 29 mmol/L   Calcium 9.9 8.7 - 10.2 mg/dL   Total Protein 7.5 6.0 - 8.5 g/dL   Albumin 4.5 4.0 - 5.0 g/dL   Globulin, Total 3.0 1.5 - 4.5 g/dL   Bilirubin Total <9.7 0.0 - 1.2 mg/dL   Alkaline Phosphatase 93 42 - 106 IU/L   AST 20 0 - 40 IU/L   ALT 18 0 - 32 IU/L  Lipid Panel w/o Chol/HDL Ratio   Collection Time: 05/24/24  4:47 PM  Result Value Ref Range   Cholesterol, Total 159 100 - 169 mg/dL   Triglycerides 885 (H) 0 - 89 mg/dL   HDL 44 >60 mg/dL   VLDL Cholesterol Cal 21 5 - 40 mg/dL   LDL Chol Calc (NIH) 94 0 - 109 mg/dL  TSH   Collection Time: 05/24/24  4:47 PM  Result Value Ref  Range   TSH 6.160 (H) 0.450 - 4.500 uIU/mL  VITAMIN D  25 Hydroxy (Vit-D Deficiency, Fractures)    Collection Time: 05/24/24  4:47 PM  Result Value Ref Range   Vit D, 25-Hydroxy 49.6 30.0 - 100.0 ng/mL  B12   Collection Time: 05/24/24  4:47 PM  Result Value Ref Range   Vitamin B-12 278 232 - 1,245 pg/mL      Assessment & Plan:   Problem List Items Addressed This Visit       Endocrine   IFG (impaired fasting glucose)   Rechecking labs today. Will increase her metformin  to 1000mg  BID. Call with any concerns. Recheck 2-3 months.       Relevant Orders   Bayer DCA Hb A1c Waived   Comprehensive metabolic panel with GFR     Other   Morbid obesity (HCC) - Primary   Wegovy  not covered by her insurance anymore. Will try to get it covered due to her co-morbidities and will check for OSA given snoring. Follow up 2-3 months. Will increase her metformin  to 1000mg  BID to avoid weight regain. Call with any concerns.        Relevant Medications   metFORMIN  (GLUCOPHAGE ) 500 MG tablet   Other Visit Diagnoses       Snoring       Will check sleep study to look for OSA. Await results. Treat as needed.   Relevant Orders   Ambulatory referral to Sleep Studies     Needs flu shot       Flu shot given today.   Relevant Orders   Flu vaccine trivalent PF, 6mos and older(Flulaval,Afluria,Fluarix,Fluzone) (Completed)     Need for pneumococcal 20-valent conjugate vaccination       Prevnar given today.   Relevant Orders   Pneumococcal conjugate vaccine 20-valent (Prevnar 20) (Completed)        Follow up plan: Return 2-3 months.

## 2024-08-03 NOTE — Assessment & Plan Note (Signed)
 Rechecking labs today. Will increase her metformin  to 1000mg  BID. Call with any concerns. Recheck 2-3 months.

## 2024-08-04 LAB — COMPREHENSIVE METABOLIC PANEL WITH GFR
ALT: 14 IU/L (ref 0–32)
AST: 14 IU/L (ref 0–40)
Albumin: 4.5 g/dL (ref 4.0–5.0)
Alkaline Phosphatase: 65 IU/L (ref 42–106)
BUN/Creatinine Ratio: 11 (ref 9–23)
BUN: 7 mg/dL (ref 6–20)
Bilirubin Total: <0.2 mg/dL (ref 0.0–1.2)
CO2: 22 mmol/L (ref 20–29)
Calcium: 10 mg/dL (ref 8.7–10.2)
Chloride: 101 mmol/L (ref 96–106)
Creatinine, Ser: 0.61 mg/dL (ref 0.57–1.00)
Globulin, Total: 3.1 g/dL (ref 1.5–4.5)
Glucose: 79 mg/dL (ref 70–99)
Potassium: 4.2 mmol/L (ref 3.5–5.2)
Sodium: 137 mmol/L (ref 134–144)
Total Protein: 7.6 g/dL (ref 6.0–8.5)
eGFR: 132 mL/min/1.73 (ref >59)

## 2024-08-04 LAB — BAYER DCA HB A1C WAIVED: HB A1C (BAYER DCA - WAIVED): 4.9 % (ref 4.8–5.6)

## 2024-08-06 ENCOUNTER — Ambulatory Visit: Payer: Self-pay | Admitting: Family Medicine

## 2024-08-17 ENCOUNTER — Telehealth: Payer: Self-pay | Admitting: Pharmacy Technician

## 2024-08-17 NOTE — Telephone Encounter (Signed)
-----   Message from [facility] Brittany R sent at 08/10/2024  8:29 AM EST ----- Can you guys submit the appeal with the letter please? ----- Message ----- From: Vicci Duwaine SQUIBB, DO Sent: 08/03/2024   4:54 PM EST To: [facility]-Clinical Pod A  Can we please place an appeal for her wegovy - letter is on her chart.

## 2024-08-17 NOTE — Telephone Encounter (Signed)
 The letter from Dr. Vicci is in the chart under chart review and then letters. It was written 08/03/24.

## 2024-08-17 NOTE — Telephone Encounter (Signed)
 I do not see what letter you are referring to. Can you tell me where to find it?   Unfortunately her insurance doesn't cover this medication for weight-loss any longer. Our pharmacist said she hasn't been able to get any covered, for special circumstances like hers, either, since they changed the guidelines October 1st.

## 2024-08-19 ENCOUNTER — Other Ambulatory Visit (HOSPITAL_COMMUNITY): Payer: Self-pay

## 2024-08-19 NOTE — Telephone Encounter (Signed)
 I just did a test claim and this pt's Wegovy  goes through, so it may be that they kept her approval due to her special circumstances. Or it might not work next month. We just don't know until the pharmacy tries to fill it. It does look like the prescription that was on file has been discontinued, so her pharmacy may just need a new prescription. I apologize for the back and forth. We were told all PAs would be over turned October 1st and Medicaid would no longer cover Wegovy  or Zepbound for weight-loss.   I called [facility] and they were able to process the 1 fill they had left on file, for a 28 day supply. They will have to order it and it will not be ready until sometime after 10am tomorrow.

## 2024-08-19 NOTE — Telephone Encounter (Signed)
 Can find no documentation that a PA for Wegovy  has been completed and denied.  Would need a denial letter from the insurance to complete an appeal.

## 2024-08-26 ENCOUNTER — Ambulatory Visit: Payer: MEDICAID | Admitting: Family Medicine

## 2024-08-30 ENCOUNTER — Telehealth: Payer: Self-pay | Admitting: Family Medicine

## 2024-08-30 NOTE — Telephone Encounter (Signed)
 Please have her do it in case there are more issues with coverage

## 2024-08-30 NOTE — Telephone Encounter (Signed)
 Called and notified patient's mother of providers message.

## 2024-08-30 NOTE — Telephone Encounter (Signed)
 Copied from CRM [id number]. Topic: General - Other >> Aug 30, 2024 11:52 AM Hadassah PARAS wrote: Reason for CRM: Pt's mother, Andree, is calling to see if pt still needs to have sleep study performed since the weight loss medication has been prescribed (shot has been given). Please advise #[phone]

## 2024-08-30 NOTE — Telephone Encounter (Signed)
 Routing to provider to advise.

## 2024-08-31 ENCOUNTER — Ambulatory Visit: Payer: MEDICAID | Admitting: Neurology

## 2024-08-31 ENCOUNTER — Encounter: Payer: Self-pay | Admitting: Neurology

## 2024-08-31 VITALS — BP 126/88 | HR 90 | Ht 63.0 in | Wt 238.6 lb

## 2024-08-31 DIAGNOSIS — F840 Autistic disorder: Secondary | ICD-10-CM

## 2024-08-31 DIAGNOSIS — R451 Restlessness and agitation: Secondary | ICD-10-CM

## 2024-08-31 DIAGNOSIS — F79 Unspecified intellectual disabilities: Secondary | ICD-10-CM | POA: Diagnosis not present

## 2024-08-31 DIAGNOSIS — F439 Reaction to severe stress, unspecified: Secondary | ICD-10-CM | POA: Insufficient documentation

## 2024-08-31 DIAGNOSIS — F913 Oppositional defiant disorder: Secondary | ICD-10-CM

## 2024-08-31 NOTE — Progress Notes (Signed)
 @[facility]   Provider:  Dedra Gores, MD   Primary Care Physician:  Vicci Duwaine SQUIBB, DO [address] [place] [address]   Referring Provider: Vicci Duwaine SQUIBB, Do [address] [place],  [place] [address]        Chief Concern for this Consultation:   Patient presents with          HPI: I have the pleasure of meeting with Mia Miller , on 08/31/24 , who is a 18 y.o.  female patient with Autism, non verbal, non guided- ,  seen here with her mother, upon a referral by Duwaine Vicci, DO of [facility] in [place],   for a  Sleep Medicine Consultation based on Obesity, snoring . BMI 40.2 .  The patient is according to her mother very hard to control and has behaviour of rage , destruction and physical aggression.   I like to point out that the referral information stated none of these conditions that will make an in lab sleep study impossible.    The patient is watching loud videos on her smart phone-  She is not willing to stop.     The patient's referral information stated :  Morbid obesity (HCC) - Primary     Wegovy  not covered by her insurance anymore. Will try to get it covered due to her co-morbidities and will check for OSA given snoring. Follow up 2-3 months. Will increase her metformin  to 1000mg  BID to avoid weight regain. Call with any concerns.     Chief concern according to patient:  @PTNAME  presented with a medical history of  Past Medical History:  Diagnosis Date   Acquired adduction deformity of foot, right    ADD (attention deficit disorder)    ADHD    Asthma    Atopic dermatitis    Autism    Chronic constipation    Dental caries    Development delay    Encopresis with constipation and overflow incontinence    Hypothyroid    Mental retardation, mild (I.Q. 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 She a had tonsillectomy and adenoid ectomy at age 48.   NO Sleep walking, but  she gets up at any hour of the night and becomes active,  this may be 1 AM or 3 Am and is usually active  all over the home and on her phone.  The patient had no previous sleep evaluations.  Family medical history: There are  no biological family members affected by Sleep apnea .      Social history:  Mia Miller is severely mentally delayed, autistic and non verbal with  behavior challenges of aggression and agitation.  She  lives in a  private home with her mother, father is not involved,  she is exposed to passive smoke,   The workplace involves physical activity, outdoor activity, travel.  Caffeine intake in form of: Coffee (/), Soft drinks (2-3 a day), Tea ( 1-2  a day)  no Energy drinks ( including those containing  taurine ). Caffeine is last consumed in PM .  Exercises regularly: , walking 3-4 times a day, 1 mile .  Phone activity -     Sleep habits and routines are as follows: The patient's dinner time is around 6 PM.   The patient goes to bed at, or close to, 8 PM. The bedroom is shared with mom and is described as cool, quiet, and dark.  The patient reports that it takes 2-3 hours ( she is on the phone or  watches Tv )  to fall asleep, then continues to sleep for 3-4 hours, woken up  by the need to void (Nocturia).   She has frequently enuresis.   The preferred sleep position is variable , with support of 1 pillow, (non- adjustable bed/ flat). The total estimated sleep time is circa 6 hours.  Dreams are reportedly rare/ frequent/ and can be vivid. Dream enactment has not been reported.   There is no  average week- day rise time.  If Sarabelle didn't sleep ,mom will not wake her. Any time between 6.30 - 11 AM.   After morning medication , mom reports her to be drowsy-  Naps in daytime are taken frequently (there is a desire to nap and opportunity), lasting from 1-2 hours . These do not interfere with nocturnal sleep.    Review of Systems: Out of a complete 14 system review, the patient complains of only the following symptoms, and all other reviewed systems are negative.:   Free running sleep cycle, no rise time no bedtime, no sleep hygiene, TV and phone use at bedtime.    Autism related nocturnal WASO time.    How likely are you to doze in the following situations: 0 = not likely, 1 = slight chance, 2 = moderate chance, 3 = high chance Sitting and Reading? Watching Television? Sitting inactive in a public place (theater or meeting)? As a passenger in a car for an hour without a break? Lying down in the afternoon when circumstances permit? Sitting and talking to someone? Sitting quietly after lunch without alcohol? In a car, while stopped for a few minutes in traffic?   Total ESS =cannot be determined. 12 / 24 points.    FSS endorsed at 35/ 63 points.  GDS: n/ a  Social History   Socioeconomic History   Marital status: Single    Spouse name: Not on file   Number of children: Not on file   Years of education: Not on file   Highest education level: 9th grade  Occupational History   Not on file  Tobacco Use   Smoking status: Never    Passive exposure: Yes   Smokeless tobacco:  Never   Tobacco comments:    inside smoking  Vaping Use   Vaping status: Never Used  Substance and Sexual Activity   Alcohol use: No   Drug use: No   Sexual activity: Never  Other Topics Concern   Not on file  Social History Narrative   Lives with her mother, maternal grandmother, cousins, aunt, half brother.    She enjoys walking and going to the park.    Attends [employer] as a rising  9th grader      1-2 cups of tea    Social Drivers of Health   Financial Resource Strain: Medium Risk (08/02/2024)   Overall Financial Resource Strain (CARDIA)    Difficulty of Paying Living Expenses: Somewhat hard  Food Insecurity: Food Insecurity Present (08/02/2024)   Hunger Vital Sign    Worried About Running Out of Food in the Last Year: Sometimes true    Ran Out of Food in the Last Year: Sometimes true  Transportation Needs: No Transportation Needs (08/02/2024)   PRAPARE - Administrator, Civil Service (Medical): No    Lack of Transportation (Non-Medical): No  Physical Activity: Sufficiently Active (08/02/2024)   Exercise Vital Sign    Days of Exercise per Week: 7 days    Minutes of Exercise per Session: 150+ min  Stress: No Stress Concern Present (08/02/2024)   [employer] of Occupational Health - Occupational Stress Questionnaire    Feeling of Stress: Not at all  Social Connections: Socially Isolated (08/02/2024)   Social Connection and Isolation Panel    Frequency of Communication with Friends and Family: Never    Frequency of Social Gatherings with Friends and Family: More than three times a week    Attends Religious Services: Never    Database Administrator or Organizations: No    Attends Engineer, Structural: Not on file    Marital Status: Separated    Family History  Problem Relation Age of Onset   Anxiety disorder Mother    Depression Mother    Migraines Mother    Bipolar disorder Mother    Seizures Maternal Aunt    Migraines Maternal Aunt     ADD / ADHD Maternal Aunt    ADD / ADHD Maternal Aunt    Mental illness Maternal Grandmother    Stroke Maternal Grandmother    Thyroid  disease Maternal Grandmother    Heart disease Maternal Grandmother    Migraines Maternal Grandmother    Hypertension Maternal Grandfather    COPD Maternal Grandfather    Heart disease Maternal Grandfather    Seizures Maternal Grandfather    ADD / ADHD Cousin        Strong Mfhx of ADHD   Apraxia Cousin        Maternal 1 st cousin   Autism Cousin        Maternal 1 st cousin   Cancer Neg Hx    Diabetes Neg Hx     Past Medical History:  Diagnosis Date   Acquired adduction deformity of foot, right    ADD (attention deficit disorder)    ADHD    Asthma    Atopic dermatitis    Autism    Chronic constipation    Dental caries    Development delay    Encopresis with constipation and overflow incontinence    Hypothyroid    Mental retardation, mild (I.Q. 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    Vitamin D  deficiency     Past Surgical History:  Procedure Laterality Date   ADENOIDECTOMY  age 50   DENTAL REHABILITATION     DENTAL SURGERY     TONSILLECTOMY     age 55   TYMPANOSTOMY TUBE  PLACEMENT  2007     Current Outpatient Medications on File Prior to Visit  Medication Sig Dispense Refill   albuterol  (PROVENTIL ) (2.5 MG/3ML) 0.083% nebulizer solution USE 1 VIAL VIA NEBULIZER EVERY 6 HOURS AS NEEDED FOR WHEEZING OR SHORTNESS OF BREATH 150 mL 2   albuterol  (VENTOLIN  HFA) 108 (90 Base) MCG/ACT inhaler Inhale 2 puffs into the lungs every 4 (four) hours as needed for wheezing or shortness of breath. 18 g 6   ARIPiprazole (ABILIFY) 2 MG tablet Take 2 mg by mouth 2 (two) times daily.     BLISOVI FE 1/20 1-20  MG-MCG tablet TAKE 1 TABLET BY MOUTH DAILY 84 tablet 3   budesonide -formoterol  (SYMBICORT ) 80-4.5 MCG/ACT inhaler INHALE 2 PUFFS TWICE A DAY RINSE MOUTH WITH WATER AFTER EACH USE 10.2 g 6   cholecalciferol (VITAMIN D3) 25 MCG (1000 UNIT) tablet Take 1,000 Units by mouth daily.     clindamycin  (CLEOCIN  T) 1 % external solution Apply topically See admin instructions. APPLY TOPICALLY TO AXILLA ONCE DAILY 60 mL 9   clobetasol  (OLUX ) 0.05 % topical foam Apply topically 3 (three) times a week. Apply to scalp 3 times a week Tuesday, Thursday, Saturday at bedtime and leave on overnight, avoid face, groin, axilla 50 g 6   doxycycline  (VIBRAMYCIN ) 50 MG capsule TAKE 1 CAPSULE BY MOUTH ONCE A DAY WITH DINNER( TAKE WITH FOOD AND DRINK) 30 capsule 6   FOCALIN XR 20 MG 24 hr capsule Take 20 mg by mouth daily.     FOCALIN XR 25 MG CP24 Take 1 capsule by mouth daily.     guanFACINE  (INTUNIV ) 4 MG TB24 ER tablet Take 4 mg by mouth daily.     ketoconazole  (NIZORAL ) 2 % cream Apply topically See admin instructions. APPLY 1 APPLICATORFUL TOPICALLY TO NECK AND LEGS DAILY 60 g 6   ketoconazole  (NIZORAL ) 2 % shampoo Apply 1 Application topically 3 (three) times a week. Wash scalp 3 times weekly, Monday, Wednesday, Friday, let sit 10 minutes and rinse out 120 mL 6   metFORMIN  (GLUCOPHAGE ) 500 MG tablet Take 2 tablets (1,000 mg total) by mouth 2 (two) times daily with a meal. 360 tablet 1   mometasone  (ELOCON ) 0.1 % lotion APPLY TOPICALLY THREE TIMES A WEEK TO SCALP ON MONDAY, WEDNESDAY AND FRIDAYS 60 mL 3   nystatin  cream (MYCOSTATIN ) APPLY TO AFFECTED AREA(s) TWICE DAILY 30 g 0   nystatin  powder Apply topically 3 (three) times daily. 60 g 4   omeprazole  (PRILOSEC) 20 MG capsule TAKE 1 CAPSULE(20 MG) BY MOUTH DAILY 90 capsule 1   ondansetron  (ZOFRAN -ODT) 4 MG disintegrating tablet Take 1 tablet (4 mg total) by mouth every 8 (eight) hours as needed for nausea or vomiting. 90 tablet 0   rOPINIRole  (REQUIP ) 0.5 MG tablet TAKE  1 TABLET(0.5 MG) BY MOUTH AT BEDTIME 90 tablet 1   SODIUM FLUORIDE 5000 PPM 1.1 % PSTE Brush to teeth once or twice daily. Do not rinse after use. Do not eat or drink for 30 minutes after use.     Spacer/Aero-Holding Chambers DEVI 1 each by Does not apply route as needed. 1 each 3   traZODone (DESYREL) 50 MG tablet Take 50-100 mg by mouth at bedtime.     Vitamin D , Ergocalciferol , (DRISDOL ) 1.25 MG (50000 UNIT) CAPS capsule Take 1 capsule (50,000 Units total) by mouth every 7 (seven) days. 12 capsule 1   Current Facility-Administered Medications on File Prior to Visit  Medication Dose Route Frequency Provider Last Rate  Last Admin   cyanocobalamin  (VITAMIN B12) injection 1,000 mcg  1,000 mcg Intramuscular Q30 days    1,000 mcg at 11/20/23 1044    Allergies  Allergen Reactions   Amoxicillin Diarrhea   Lansoprazole Diarrhea   Loratadine Other (See Comments)    Dried out very bad    Vitals:   08/31/24 1019  BP: 126/88  Pulse: 90  SpO2: 99%     Physical exam:   General: appears not in acute distress.  Mood and affect : autism  The patient's interactions are: uncooperative, makes little eye contact, cannot  follow the instructions and cannot answer questions coherently.  The patient is groomed and appropriately groomed and dressed. Head: Normocephalic, atraumatic.  Neck is supple. Mallampati: 3.  The neck circumference measured 18.5  inches.  Hirsutism.  Nasal airflow couldn't be evaluated.   Overbite / Retrognathia was noted.  Dental status: biological , poor dental status.  Cardiovascular:  Regular rate and cardiac rhythm by palpable pulse. Respiratory: no audible wheezing, no tachypnoea.   Skin:  Without evidence of ankle edema. No discoloration.  Trunk:  BMI is 40 plus  The patient's posture was slouched.   Neurologic exam : asia, and of normal volume.     Cranial nerves:  There was no loss of smell or taste reported  Pupils are round, equal in size and briskly  reactive to light.  Funduscopic exam was deferred.  Hearing was intact to soft voice.    Facial motor strength: Symmetric movement and tongue and uvula move midline.  Neck ROM: rotation, tilt and flexion extension were intact for age and shoulder shrug was symmetrical.    Motor exam:  Symmetric bulk, strength and ROM.   Normal tone without cog- wheeling, and symmetric grip strength. Gait was  wide based, shuffling.  Arm swing was preserved. The patient's gait posture was stooped.   Deep tendon reflexes: Upper extremities did show symmetric DTRs. Lower extremity DTRs were symmetric and brisk/ attenuated.      I would like to inform Vicci Duwaine SQUIBB, DO  that the evaluation of this patient was difficult, and took an extraordinary amount of time.   In short, HOUA NIE is presenting with morbid obesity, poor sleep habits, and possible apnea.  based on her underlying  autism condition, behaviour pattern and low IQ it would be most difficult to perform a sleep study , even more difficult to implement a treatment for OSA -if  found.   The diagnosis may help to obtain zepbound , and I will use a HST to test for OSA. Her mother feels she has a better chance to apply the [facility] device after Margarie is asleep.    If that is not successful, I strongly recommend referral to a tertiary care center.  ,  Risk factors for OSA were present,  including : Body mass index is 42.27 kg/m., neck size and upper airway anatomy.    My Plan is to proceed with:  HST/ Sansa  ordered today     I plan to follow up /through our NP within 5-8 months.   A total time of  45  minutes consistent of a part of face to face encounter , exam and interview,  and additional preparation time for chart review was spent .  At today's visit, we discussed treatment options, associated risk and benefits, and engage in counseling as needed including, but not limited to:  Sleep hygiene, Quality Sleep Habits, and Safety concerns  for  patients with daytime sleepiness who are warned to not operate machinery/ motor vehicles when drowsy.  Risk factors for sleep apnea were identified:  Body mass index is 42.27 kg/m..  Additionally, the following were reviewed: Past medical records, past medical and surgical history, family and social background, as well as relevant laboratory results, imaging findings, and medical notes, where applicable.  This note was generated by myself in part by using dictation software, and as a result, it may contain unintentional typos and errors.  Nevertheless, effort was made to accurately convey the pertinent aspects of the patient's visit.   Dedra Gores, MD @servicedate @  [facility] Board certified in Sleep Medicine by The [employer] of Sleep Medicine and Diplomate of the [employer] of [employer] (AASM) . Board certified In Neurology, Diplomat of the ABPN,  Fellow of the [employer] of Neu[employer].

## 2024-08-31 NOTE — Patient Instructions (Signed)
 Quality Sleep Information, Adult Quality sleep is important for your mental and physical health. It also improves your quality of life. Quality sleep means you: Are asleep for most of the time you are in bed. Fall asleep within 30 minutes. Wake up no more than once a night. Are awake for no longer than 20 minutes if you do wake up during the night. Most adults need 7-8 hours of quality sleep each night. How can poor sleep affect me? If you do not get enough quality sleep, you may have: Mood swings. Daytime sleepiness. Decreased alertness, reaction time, and concentration. Sleep disorders, such as insomnia and sleep apnea. Difficulty with: Solving problems. Coping with stress. Paying attention. These issues may affect your performance and productivity at work, school, and home. Lack of sleep may also put you at higher risk for accidents, suicide, and risky behaviors. If you do not get quality sleep, you may also be at higher risk for several health problems, including: Infections. Type 2 diabetes. Heart disease. High blood pressure. Obesity. Worsening of long-term conditions, like arthritis, kidney disease, depression, Parkinson's disease, and epilepsy. What actions can I take to get more quality sleep? Sleep schedule and routine Stick to a sleep schedule. Go to sleep and wake up at about the same time each day. Do not try to sleep less on weekdays and make up for lost sleep on weekends. This does not work. Limit naps during the day to 30 minutes or less. Do not take naps in the late afternoon. Make time to relax before bed. Reading, listening to music, or taking a hot bath promotes quality sleep. Make your bedroom a place that promotes quality sleep. Keep your bedroom dark, quiet, and at a comfortable room temperature. Make sure your bed is comfortable. Avoid using electronic devices that give off bright blue light for 30 minutes before bedtime. Your brain perceives bright blue light  as sunlight. This includes television, phones, and computers. If you are lying awake in bed for longer than 20 minutes, get up and do a relaxing activity until you feel sleepy. Lifestyle     Try to get at least 30 minutes of exercise on most days. Do not exercise 2-3 hours before going to bed. Do not use any products that contain nicotine or tobacco. These products include cigarettes, chewing tobacco, and vaping devices, such as e-cigarettes. If you need help quitting, ask your health care provider. Do not drink caffeinated beverages for at least 8 hours before going to bed. Coffee, tea, and some sodas contain caffeine. Do not drink alcohol or eat large meals close to bedtime. Try to get at least 30 minutes of sunlight every day. Morning sunlight is best. Medical concerns Work with your health care provider to treat medical conditions that may affect sleeping, such as: Nasal obstruction. Snoring. Sleep apnea and other sleep disorders. Talk to your health care provider if you think any of your prescription medicines may cause you to have difficulty falling or staying asleep. If you have sleep problems, talk with a sleep consultant. If you think you have a sleep disorder, talk with your health care provider about getting evaluated by a specialist. Where to find more information Sleep Foundation: sleepfoundation.org American Academy of Sleep Medicine: aasm.org Centers for Disease Control and Prevention (CDC): tonerpromos.no Contact a health care provider if: You have trouble getting to sleep or staying asleep. You often wake up very early in the morning and cannot get back to sleep. You have daytime sleepiness. You  have daytime sleep attacks of suddenly falling asleep and sudden muscle weakness (narcolepsy). You have a tingling sensation in your legs with a strong urge to move your legs (restless legs syndrome). You stop breathing briefly during sleep (sleep apnea). You think you have a sleep  disorder or are taking a medicine that is affecting your quality of sleep. Summary Most adults need 7-8 hours of quality sleep each night. Getting enough quality sleep is important for your mental and physical health. Make your bedroom a place that promotes quality sleep, and avoid things that may cause you to have poor sleep, such as alcohol, caffeine, smoking, or large meals. Talk to your health care provider if you have trouble falling asleep or staying asleep. This information is not intended to replace advice given to you by your health care provider. Make sure you discuss any questions you have with your health care provider. Document Revised: 01/16/2022 Document Reviewed: 01/16/2022 Elsevier Patient Education  2024 [employer].           In short, Mia Miller is presenting with morbid obesity, poor sleep habits, and possible apnea.  based on her underlying  autism condition, behaviour pattern and low IQ it would be most difficult to perform a sleep study , even more difficult to implement a treatment for OSA -if  found.   The diagnosis may help to obtain zepbound , and I will use a HST to test for OSA. Her mother feels she has a better chance to apply the [facility] device after Mia Miller is asleep.    If that is not successful, I strongly recommend referral to a tertiary care center.  ,  Risk factors for OSA were present,  including : Body mass index is 42.27 kg/m., neck size and upper airway anatomy.    My Plan is to proceed with:  HST/ Sansa  ordered today

## 2024-09-08 ENCOUNTER — Other Ambulatory Visit: Payer: Self-pay | Admitting: Family Medicine

## 2024-09-11 NOTE — Telephone Encounter (Signed)
 Requested medication (s) are due for refill today: Yes  Requested medication (s) are on the active medication list: Yes  Last refill: 07/20/24  Future visit scheduled: Yes  Notes to clinic:  Unable to refill due to no refill protocol for this medication.      Requested Prescriptions  Pending Prescriptions Disp Refills   nystatin  cream (MYCOSTATIN ) [Pharmacy Med Name: NYSTATIN  100000 UNIT/GM TOP CREAM G] 30 g 0    Sig: APPLY TO AFFECTED AREA(s) TWICE DAILY     Off-Protocol Failed - 09/11/2024  8:08 AM      Failed - Medication not assigned to a protocol, review manually.      Passed - Valid encounter within last 12 months    Recent Outpatient Visits           1 month ago Morbid obesity [facility])   [place] [facility] [place], Megan P, DO   3 months ago Acute right ankle pain   [place] [facility] Melvin Pao, NP   3 months ago Routine general medical examination at a health care facility   [facility], Megan P, DO   8 months ago Morbid obesity [facility])   [place] [facility] [place], Megan P, DO   9 months ago Morbid obesity [facility])   [place] [facility] [place], Duwaine SQUIBB, DO       Future Appointments             In 2 months Hester Alm BROCKS, MD [facility] [employer] [facility]            Signed Prescriptions Disp Refills   albuterol  (PROVENTIL ) (2.5 MG/3ML) 0.083% nebulizer solution 150 mL 2    Sig: USE 1 VIAL VIA NEBULIZER EVERY 6 HOURS AS NEEDED FOR WHEEZING OR SHORTNESS OF BREATH     Pulmonology:  Beta Agonists 2 Passed - 09/11/2024  8:08 AM      Passed - Last BP in normal range    BP Readings from Last 1 Encounters:  08/31/24 126/88         Passed - Last Heart Rate in normal range    Pulse Readings from Last 1 Encounters:  08/31/24 90         Passed - Valid encounter within last 12 months    Recent Outpatient Visits           1 month ago Morbid obesity  [facility])   [place], Megan P, DO   3 months ago Acute right ankle pain   [place] [facility] Melvin Pao, NP   3 months ago Routine general medical examination at a health care facility   [facility], Megan P, DO   8 months ago Morbid obesity [facility])   [facility] [place], Megan P, DO   9 months ago Morbid obesity [facility])    [facility] Duwaine SQUIBB, DO       Future Appointments             In 2 months Hester Alm BROCKS, MD [facility] [place] [facility]

## 2024-09-20 ENCOUNTER — Telehealth: Payer: Self-pay | Admitting: Neurology

## 2024-09-20 NOTE — Telephone Encounter (Signed)
 Medicaid [employer] will not pay for an home sleep study that only pay for in lab sleep studies. Do you want to order an in lab?

## 2024-09-22 NOTE — Telephone Encounter (Signed)
 Albino- can we see if we can get her over to see sleep at [facility]? Maybe try pediatric sleep

## 2024-10-04 ENCOUNTER — Other Ambulatory Visit: Payer: Self-pay | Admitting: Family Medicine

## 2024-10-06 ENCOUNTER — Other Ambulatory Visit: Payer: Self-pay | Admitting: Family Medicine

## 2024-10-08 ENCOUNTER — Other Ambulatory Visit: Payer: Self-pay | Admitting: Dermatology

## 2024-10-08 DIAGNOSIS — B360 Pityriasis versicolor: Secondary | ICD-10-CM

## 2024-10-08 DIAGNOSIS — L219 Seborrheic dermatitis, unspecified: Secondary | ICD-10-CM

## 2024-10-08 NOTE — Telephone Encounter (Signed)
 Discontinued on 08/03/24.  Requested Prescriptions  Pending Prescriptions Disp Refills   WEGOVY  1.7 MG/0.75ML SOAJ SQ injection [Pharmacy Med Name: WEGOVY  1.7 MG/0.75ML SUBQ SOLN ML] 9 mL 0    Sig: INJECT 1.7MG  SUBCUTANEOUSLY ONCE A WEEK     Endocrinology:  Diabetes - GLP-1 Receptor Agonists - semaglutide  Passed - 10/08/2024 10:57 AM      Passed - HBA1C in normal range and within 180 days    HB A1C (BAYER DCA - WAIVED)  Date Value Ref Range Status  08/03/2024 4.9 4.8 - 5.6 % Final    Comment:             Prediabetes: 5.7 - 6.4          Diabetes: >6.4          Glycemic control for adults with diabetes: <7.0          Passed - Cr in normal range and within 360 days    Creat  Date Value Ref Range Status  12/01/2019 0.59 0.40 - 1.00 mg/dL Final   Creatinine, Ser  Date Value Ref Range Status  08/03/2024 0.61 0.57 - 1.00 mg/dL Final         Passed - Valid encounter within last 6 months    Recent Outpatient Visits           2 months ago Morbid obesity [facility])   [place] [facility] [place], Mia Miller, Mia Miller   4 months ago Acute right ankle pain   [facility] [facility] Melvin Pao, NP   4 months ago Routine general medical examination at a health care facility   [facility], Mia Miller, Mia Miller   9 months ago Morbid obesity [facility])   [place] [place] [facility] [place], Mia Miller, Mia Miller   10 months ago Morbid obesity [facility])   [place] [facility] Mia Duwaine SQUIBB, Mia Miller       Future Appointments             In 1 month Mia Alm BROCKS, Mia Miller [facility] [place] [facility]

## 2024-10-09 ENCOUNTER — Other Ambulatory Visit: Payer: Self-pay | Admitting: Family Medicine

## 2024-10-11 NOTE — Telephone Encounter (Signed)
 Requested medication (s) are due for refill today: yes  Requested medication (s) are on the active medication list: yes  Last refill:  09/13/24  Future visit scheduled: {Yes  Notes to clinic:  Medication not assigned to a protocol, review manually.      Requested Prescriptions  Pending Prescriptions Disp Refills   nystatin  cream (MYCOSTATIN ) [Pharmacy Med Name: NYSTATIN  100000 UNIT/GM TOP CREAM G] 30 g 0    Sig: APPLY TO AFFECTED AREA(s) TWICE DAILY     Off-Protocol Failed - 10/11/2024  4:23 PM      Failed - Medication not assigned to a protocol, review manually.      Passed - Valid encounter within last 12 months    Recent Outpatient Visits           2 months ago Morbid obesity [facility])   [place] [facility] [place], Megan P, DO   4 months ago Acute right ankle pain   [employer] [facility]vin Pao, NP   4 months ago Routine general medical examination at a health care facility   [facility], Megan P, DO   9 months ago Morbid obesity [facility])   [place] [facility] [place], Megan P, DO   10 months ago Morbid obesity [facility])   [place] [facility] Vicci Duwaine SQUIBB, DO       Future Appointments             In 1 month Hester Alm BROCKS, MD [facility]

## 2024-10-12 ENCOUNTER — Other Ambulatory Visit: Payer: Self-pay | Admitting: Family Medicine

## 2024-10-12 ENCOUNTER — Ambulatory Visit: Payer: MEDICAID | Admitting: Family Medicine

## 2024-10-13 ENCOUNTER — Telehealth: Payer: Self-pay

## 2024-10-13 DIAGNOSIS — R0683 Snoring: Secondary | ICD-10-CM

## 2024-10-13 DIAGNOSIS — F840 Autistic disorder: Secondary | ICD-10-CM

## 2024-10-13 NOTE — Telephone Encounter (Signed)
 Copied from CRM [id number]. Topic: Clinical - Prescription Issue >> Oct 13, 2024 12:35 PM [name] wrote: Reason for CRM: Mia Miller (legal guardian) calling regarding Semaglutide  recently denied for refill. Per 10/28 appt notes, it looks like it was discontinued due to insurance coverage. Mia says Dr. Vicci said she would do everything she can to try to get it covered due to co morbidities. Mia Miller is requesting this medication be refilled and please return call at #[phone]. She says this is urgent because she has no medication for this week.

## 2024-10-13 NOTE — Telephone Encounter (Signed)
 Can we send in a new prescription for the patient so a PA can be attempted? Or is there another option for the patient?

## 2024-10-14 NOTE — Telephone Encounter (Signed)
 I have already written an appeal letter that we sent at the end of last year. I'm not sure if there is anything else we can do- can we please check with the PA team and also see about her sleep study appointment with peds sleep?

## 2024-10-14 NOTE — Telephone Encounter (Signed)
 Reviewed patient's chart and referral. Mychart was sent to patient's mother on 08/11/24 with appointment information for the sleep center at [facility]. It doesn't look like this message was ever read by the patient's mother.   Elder, I see you were messaging with this patient's mother. Can they just contact you guys and reschedule the appointment? Or do you guys need a new referral?

## 2024-10-14 NOTE — Telephone Encounter (Signed)
 Routing to PA team to check on prior authorization, and to referral team to check on referral.

## 2024-10-20 ENCOUNTER — Other Ambulatory Visit (HOSPITAL_COMMUNITY): Payer: Self-pay

## 2024-10-21 NOTE — Telephone Encounter (Signed)
 Mia Miller, her insurance was not paying for the home sleep study and she was not able to do one in house with [facility]. They recommended seeing peds sleep at [facility], which there should be a new referral in for- that's what I was wanting to check on.

## 2024-10-21 NOTE — Addendum Note (Signed)
 Addended by: VICCI DUWAINE SQUIBB on: 10/21/2024 03:21 PM   Modules accepted: Orders

## 2024-10-21 NOTE — Telephone Encounter (Signed)
 Called and LVM asking for patient's mother to please return my call.   OK for [id number] to speak to patient's mother and ask her about patient's medication per Dr. Ferdie questions in her message below.

## 2024-10-21 NOTE — Telephone Encounter (Signed)
 Can we please confirm with Mom that she has been on her wegovy  consistently and what dose she is currently taking- it is not on her current med list and I need to see if we are restarting medication. Thanks!

## 2024-11-16 ENCOUNTER — Ambulatory Visit: Payer: MEDICAID | Admitting: Dermatology

## 2024-11-19 ENCOUNTER — Ambulatory Visit: Payer: MEDICAID | Admitting: Family Medicine
